# Patient Record
Sex: Female | Born: 1990 | Race: Black or African American | Hispanic: No | Marital: Single | Smoking: Never smoker
Health system: Southern US, Community
[De-identification: ages and names within clinical notes are randomized; demographics above are authoritative.]

## PROBLEM LIST (undated history)

## (undated) DIAGNOSIS — D649 Anemia, unspecified: Secondary | ICD-10-CM

---

## 2015-04-17 ENCOUNTER — Encounter (HOSPITAL_COMMUNITY): Payer: Self-pay | Admitting: Oncology

## 2015-04-17 ENCOUNTER — Emergency Department (HOSPITAL_COMMUNITY)
Admission: EM | Admit: 2015-04-17 | Discharge: 2015-04-17 | Discharge status: Against Medical Advice | Payer: Self-pay | Attending: Emergency Medicine | Admitting: Emergency Medicine

## 2015-04-17 DIAGNOSIS — Y9389 Activity, other specified: Secondary | ICD-10-CM | POA: Insufficient documentation

## 2015-04-17 DIAGNOSIS — Y998 Other external cause status: Secondary | ICD-10-CM | POA: Insufficient documentation

## 2015-04-17 DIAGNOSIS — S01311A Laceration without foreign body of right ear, initial encounter: Secondary | ICD-10-CM | POA: Insufficient documentation

## 2015-04-17 DIAGNOSIS — Y9289 Other specified places as the place of occurrence of the external cause: Secondary | ICD-10-CM | POA: Insufficient documentation

## 2015-04-17 DIAGNOSIS — Z720 Tobacco use: Secondary | ICD-10-CM | POA: Insufficient documentation
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
## 2016-08-08 ENCOUNTER — Encounter (HOSPITAL_COMMUNITY): Payer: Self-pay | Admitting: Family Medicine

## 2016-08-08 ENCOUNTER — Ambulatory Visit (HOSPITAL_COMMUNITY)
Admission: EM | Admit: 2016-08-08 | Discharge: 2016-08-08 | Disposition: A | Discharge status: Home / Self Care | Payer: Self-pay | Attending: Emergency Medicine | Admitting: Emergency Medicine

## 2016-08-08 DIAGNOSIS — K0889 Other specified disorders of teeth and supporting structures: Secondary | ICD-10-CM

## 2016-08-08 DIAGNOSIS — K029 Dental caries, unspecified: Secondary | ICD-10-CM

## 2016-08-08 MED ORDER — HYDROCODONE-ACETAMINOPHEN 5-325 MG PO TABS
1.0000 | ORAL_TABLET | Freq: Once | ORAL | Status: AC
  Administered 2016-08-08: 1 via ORAL

## 2016-08-08 MED ORDER — HYDROCODONE-ACETAMINOPHEN 5-325 MG PO TABS: ORAL_TABLET | ORAL | 0 refills | Status: DC

## 2016-08-08 MED ORDER — PENICILLIN V POTASSIUM 500 MG PO TABS: 500.0000 mg | ORAL_TABLET | Freq: Four times a day (QID) | ORAL | 0 refills | Status: DC

## 2016-08-08 MED ORDER — HYDROCODONE-ACETAMINOPHEN 5-325 MG PO TABS
ORAL_TABLET | ORAL | Status: AC
  Filled 2016-08-08: qty 1

## 2016-08-08 MED ORDER — IBUPROFEN 600 MG PO TABS: 600.0000 mg | ORAL_TABLET | Freq: Four times a day (QID) | ORAL | 0 refills | Status: DC | PRN

## 2016-08-08 MED ORDER — BUPIVACAINE-EPINEPHRINE (PF) 0.5% -1:200000 IJ SOLN
INTRAMUSCULAR | Status: AC
  Filled 2016-08-08: qty 1.8

## 2016-08-08 MED ORDER — CHLORHEXIDINE GLUCONATE 0.12 % MT SOLN: OROMUCOSAL | 0 refills | Status: DC

## 2016-08-08 NOTE — Discharge Instructions (Signed)
600 mg of ibuprofen with 1 g of Tylenol 4 times a day. Or you may take the ibuprofen with the Norco. Do not take the Norco and the Tylenol as they both have Tylenol in them and too much can hurt your liver. Do not exceed 4 g of Tylenol from all sources in one day. Each pill of Norco has 325 mg of Tylenol in it.   Look up the [facility]'s Missions of [facility] for free dental clinics. https://[email or web address]-garcia.[email or web address]://www.ncdental.org/ncds/Schedule.asp  Get there early and be prepared to wait. [employer] and [employer] have Armed forces operational officerdental hygienist schools that provide low cost routine dental care.   Other resources: [facility] [address] [place], [place] [phone]  Patients with Medicaid: [facility]                     [facility] [id number]                                [phone] Phone:  [phone]                                                  Phone:  [phone]  Dr. Renee RivalJanice Clark [address] [phone]  If unable to pay or uninsured, contact:  [facility] or [facility] to become qualified for the adult dental clinic.  No matter what dental problem you have, it will not get better unless you get good dental care.  If the tooth is not taken care of, your symptoms will come back in time and you will be visiting us again in the Urgent Care Center with a bad toothache.  So, see your dentist as soon as possible.  If you don't have a dentist, we can give you a list of dentists.  Sometimes the most cost effective treatment is removal of the tooth.  This can be done very inexpensively through one of the low cost [facility] Centers such as the facility on [address] in [place] [phone]).  The downside to this is that you will have one less tooth and this can effect your ability to chew.  Some other things that can be done for a dental infection include the following:  Rinse your mouth out with hot salt  water (1/2 tsp of table salt and a pinch of baking soda in 8 oz of hot water).  You can do this every 2 or 3 hours. Avoid cold foods, beverages, and cold air.  This will make your symptoms worse. Sleep with your head elevated.  Sleeping flat will cause your gums and oral tissues to swell and make them hurt more.  You can sleep on several pillows.  Even better is to sleep in a recliner with your head higher than your heart. External application of heat by a heating pad, hot water bottle, or hot wet towel can help with pain and speed healing.  You can do this every 2 to 3 hours. Do not fall asleep on a heating pad since this can cause a burn.

## 2016-08-08 NOTE — ED Triage Notes (Signed)
Pt here for dental pain x 2 days. sts right wisdom tooth.

## 2016-08-08 NOTE — ED Provider Notes (Signed)
HPI  SUBJECTIVE:  Cynthia Clark is a 26 y.o. female who presents with throbbing, stabbing, swelling right upper and lower dental pain for the past 2 days. States it is intermittent, lasting hours to days. She has tried ibuprofen 600 -800 mg, Tylenol, BC powders with no improvement in her symptoms. Symptoms are worse with exposure to air. She reports right-sided facial swelling and  pain radiating to her ear. She denies fevers, trismus, trauma to her tooth. No sore throat, difficulty breathing, neck swelling, nasal congestion. Past medical history negative for diabetes, hypertension. LMP: 2 weeks ago. Denies possibility of being pregnant. PMD: None. Dentist: States that she has one but cannot remember his name.    History reviewed. No pertinent past medical history.  History reviewed. No pertinent surgical history.  History reviewed. No pertinent family history.  Social History  Substance Use Topics  . Smoking status: Current Every Day Smoker  . Smokeless tobacco: Never Used  . Alcohol use Yes    No current facility-administered medications for this encounter.   Current Outpatient Prescriptions:  .  chlorhexidine (PERIDEX) 0.12 % solution, 15 mL swish and spit bid, Disp: 480 mL, Rfl: 0 .  HYDROcodone-acetaminophen (NORCO/VICODIN) 5-325 MG tablet, 1-2 tabs q 6hr prn pain, Disp: 20 tablet, Rfl: 0 .  ibuprofen (ADVIL,MOTRIN) 600 MG tablet, Take 1 tablet (600 mg total) by mouth every 6 (six) hours as needed., Disp: 30 tablet, Rfl: 0 .  penicillin v potassium (VEETID) 500 MG tablet, Take 1 tablet (500 mg total) by mouth 4 (four) times daily. X 10 days, Disp: 40 tablet, Rfl: 0  No Known Allergies   ROS  As noted in HPI.   Physical Exam  BP 129/83   Pulse 80   Temp 98.5 F (36.9 C)   Resp 18   SpO2 99%   Constitutional: Well developed, well nourished, no acute distress Eyes:  EOMI, conjunctiva normal bilaterally HENT: Normocephalic, atraumatic,mucus membranes moist. No  appreciable facial swelling, but positive tenderness along the upper and lower face. extensive dental decay. Right upper molars #3, 2, 1, with caries, tenderness, positive gingival swelling and no gingival expressible drainage. Tooth #30 the right lower first molar with caries, tender to palpation. No gingival swelling or Expressible purulent drainage. no trismus. airway patent. Neck: No cervical lymphadenopathy Respiratory: Normal inspiratory effort Cardiovascular: Normal rate GI: nondistended skin: No rash, skin intact Musculoskeletal: no deformities Neurologic: Alert & oriented x 3, no focal neuro deficits Psychiatric: Speech and behavior appropriate   ED Course   Medications  HYDROcodone-acetaminophen (NORCO/VICODIN) 5-325 MG per tablet 1 tablet (1 tablet Oral Given 08/08/16 1857)    No orders of the defined types were placed in this encounter.   No results found for this or any previous visit (from the past 24 hour(s)). No results found.  ED Clinical Impression  Dental caries  Toothache   ED Assessment/Plan  [facility] narcotic database reviewed. No opiate prescriptions in the past 6 months.  Performed dental block on upper or lower teeth with improvement in symptoms. Used approximately 0.5 cc of bupivacaine with epinephrine. Patient Tolerated procedure well.  Home with Peridex or Listerine, penicillin, Norco or Tylenol combined with ibuprofen 600 mg 4 times a day. She states that she has a Education officer, community and advised her that she will need to follow-up with him within the next several days.  Discussed , MDM, plan and followup with patient. Discussed sn/sx that should prompt return to the ED. Patient  agrees with plan.  Meds ordered this encounter  Medications  . HYDROcodone-acetaminophen (NORCO/VICODIN) 5-325 MG per tablet 1 tablet  . HYDROcodone-acetaminophen (NORCO/VICODIN) 5-325 MG tablet    Sig: 1-2 tabs q 6hr prn pain    Dispense:  20 tablet    Refill:  0  .  chlorhexidine (PERIDEX) 0.12 % solution    Sig: 15 mL swish and spit bid    Dispense:  480 mL    Refill:  0  . penicillin v potassium (VEETID) 500 MG tablet    Sig: Take 1 tablet (500 mg total) by mouth 4 (four) times daily. X 10 days    Dispense:  40 tablet    Refill:  0  . ibuprofen (ADVIL,MOTRIN) 600 MG tablet    Sig: Take 1 tablet (600 mg total) by mouth every 6 (six) hours as needed.    Dispense:  30 tablet    Refill:  0    *This clinic note was created using Scientist, clinical (histocompatibility and immunogenetics)Dragon dictation software. Therefore, there may be occasional mistakes despite careful proofreading.  ?   Domenick GongAshley Jamarr Treinen, MD 08/09/16 1714

## 2017-01-09 ENCOUNTER — Encounter (HOSPITAL_COMMUNITY): Payer: Self-pay | Admitting: Emergency Medicine

## 2017-01-09 ENCOUNTER — Emergency Department (HOSPITAL_COMMUNITY): Payer: Self-pay

## 2017-01-09 ENCOUNTER — Emergency Department (HOSPITAL_COMMUNITY)
Admission: EM | Admit: 2017-01-09 | Discharge: 2017-01-09 | Disposition: A | Discharge status: Home / Self Care | Payer: Self-pay | Attending: Emergency Medicine | Admitting: Emergency Medicine

## 2017-01-09 DIAGNOSIS — W228XXA Striking against or struck by other objects, initial encounter: Secondary | ICD-10-CM | POA: Insufficient documentation

## 2017-01-09 DIAGNOSIS — Y929 Unspecified place or not applicable: Secondary | ICD-10-CM | POA: Insufficient documentation

## 2017-01-09 DIAGNOSIS — F17200 Nicotine dependence, unspecified, uncomplicated: Secondary | ICD-10-CM | POA: Insufficient documentation

## 2017-01-09 DIAGNOSIS — S62339A Displaced fracture of neck of unspecified metacarpal bone, initial encounter for closed fracture: Secondary | ICD-10-CM

## 2017-01-09 DIAGNOSIS — S62306A Unspecified fracture of fifth metacarpal bone, right hand, initial encounter for closed fracture: Secondary | ICD-10-CM | POA: Insufficient documentation

## 2017-01-09 DIAGNOSIS — Y939 Activity, unspecified: Secondary | ICD-10-CM | POA: Insufficient documentation

## 2017-01-09 DIAGNOSIS — Y999 Unspecified external cause status: Secondary | ICD-10-CM | POA: Insufficient documentation
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
## 2017-01-09 NOTE — ED Notes (Signed)
Ortho at bedside.

## 2017-01-09 NOTE — Progress Notes (Signed)
Orthopedic Tech Progress Note Patient Details:  Wonda AmisKatrina Rayfield 1990-12-06 [id number]  Ortho Devices Type of Ortho Device: Ace wrap, Ulna gutter splint Ortho Device/Splint Location: rue Ortho Device/Splint Interventions: Application   Anthoni Geerts 01/09/2017, 10:34 AM

## 2017-01-09 NOTE — Discharge Instructions (Signed)
Read the information below.  You may return to the Emergency Department at any time for worsening condition or any new symptoms that concern you.   If you develop uncontrolled pain, weakness or numbness of the extremity, severe discoloration of the skin, or you are unable to move your fingers, return to the ER for a recheck.
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
## 2017-01-09 NOTE — ED Notes (Signed)
Spoke with Ortho who will apply splint.  

## 2017-04-29 ENCOUNTER — Emergency Department (HOSPITAL_COMMUNITY)
Admission: EM | Admit: 2017-04-29 | Discharge: 2017-04-30 | Disposition: A | Discharge status: Home / Self Care | Payer: Self-pay | Attending: Emergency Medicine | Admitting: Emergency Medicine

## 2017-04-29 DIAGNOSIS — F17210 Nicotine dependence, cigarettes, uncomplicated: Secondary | ICD-10-CM | POA: Insufficient documentation

## 2017-04-29 DIAGNOSIS — D509 Iron deficiency anemia, unspecified: Secondary | ICD-10-CM | POA: Insufficient documentation

## 2017-04-29 DIAGNOSIS — F10920 Alcohol use, unspecified with intoxication, uncomplicated: Secondary | ICD-10-CM

## 2017-04-29 DIAGNOSIS — F10929 Alcohol use, unspecified with intoxication, unspecified: Secondary | ICD-10-CM | POA: Insufficient documentation

## 2017-04-29 DIAGNOSIS — Z79899 Other long term (current) drug therapy: Secondary | ICD-10-CM | POA: Insufficient documentation

## 2017-04-29 MED ORDER — AMMONIA AROMATIC IN INHA
RESPIRATORY_TRACT | Status: AC
  Administered 2017-04-30: 01:00:00
  Filled 2017-04-29: qty 10

## 2017-04-29 NOTE — ED Triage Notes (Signed)
Pt brought to ED via [facility]. Pt admitted to ETOH but no drug use, alert and oriented to person and place.

## 2017-04-29 NOTE — ED Provider Notes (Signed)
[facility]-EMERGENCY DEPT Provider Note   CSN: [id number] Arrival date & time: 04/29/17  2328     History   Chief Complaint Chief Complaint  Patient presents with  . Alcohol Intoxication    LEVEL 5 CAVEAT 2/2 INTOXICATION  HPI Cynthia Clark is a 26 y.o. female.  26 year old female presents to the emergency department by EMS.  She was [place] from [place], intoxicated.  Patient allegedly admitted to alcohol use, but denied drug use.  She was alert and oriented to person and place with a EMS.  She is acutely intoxicated and will not wake to sternal rubbing, but will wince with ammonia capsule. Patient unable to contribute to hx due to acute intoxication.      No past medical history on file.  There are no active problems to display for this patient.   No past surgical history on file.  OB History    No data available       Home Medications    Prior to Admission medications   Medication Sig Start Date End Date Taking? Authorizing Provider  chlorhexidine (PERIDEX) 0.12 % solution 15 mL swish and spit bid 08/08/16   Domenick GongMortenson, Ashley, MD  HYDROcodone-acetaminophen (NORCO/VICODIN) 5-325 MG tablet 1-2 tabs q 6hr prn pain 08/08/16   Domenick GongMortenson, Ashley, MD  ibuprofen (ADVIL,MOTRIN) 600 MG tablet Take 1 tablet (600 mg total) by mouth every 6 (six) hours as needed. 08/08/16   Domenick GongMortenson, Ashley, MD  penicillin v potassium (VEETID) 500 MG tablet Take 1 tablet (500 mg total) by mouth 4 (four) times daily. X 10 days 08/08/16   Domenick GongMortenson, Ashley, MD    Family History No family history on file.  Social History Social History  Substance Use Topics  . Smoking status: Current Every Day Smoker  . Smokeless tobacco: Never Used  . Alcohol use Yes     Allergies   Patient has no known allergies.   Review of Systems Review of Systems  Unable to perform ROS: Mental status change  INTOXICATED   Physical Exam Updated Vital Signs BP (!) 88/48 (BP Location:  Left Arm)   Pulse 74   Temp 98.3 F (36.8 C) (Oral)   Resp 11   SpO2 100%   Physical Exam  Constitutional: She appears well-developed and well-nourished. No distress.  HENT:  Head: Normocephalic and atraumatic.  Eyes: Conjunctivae and EOM are normal. No scleral icterus.  Neck: Normal range of motion.  Pulmonary/Chest: Effort normal. No respiratory distress. She has no wheezes.  Respirations even and unlabored  Musculoskeletal: Normal range of motion.  Neurological: She is alert.  Spontaneously moving extremities. Will flinch with ammonia capsule. No verbal response. Will not open eyes.  Skin: Skin is warm and dry. No rash noted. She is not diaphoretic. No erythema. No pallor.  Psychiatric: She has a normal mood and affect. Her behavior is normal.  Nursing note and vitals reviewed.    ED Treatments / Results  Labs (all labs ordered are listed, but only abnormal results are displayed) Labs Reviewed  CBC WITH DIFFERENTIAL/PLATELET - Abnormal; Notable for the following:       Result Value   RBC 3.86 (*)    Hemoglobin 6.1 (*)    HCT 23.8 (*)    MCV 61.7 (*)    MCH 15.8 (*)    MCHC 25.6 (*)    RDW 19.7 (*)    Neutro Abs 1.4 (*)    All other components within normal limits  COMPREHENSIVE METABOLIC PANEL -  Abnormal; Notable for the following:    Potassium 3.3 (*)    CO2 21 (*)    ALT 11 (*)    All other components within normal limits  ETHANOL - Abnormal; Notable for the following:    Alcohol, Ethyl (B) 298 (*)    All other components within normal limits  RETICULOCYTES - Abnormal; Notable for the following:    RBC. 3.81 (*)    All other components within normal limits  RAPID URINE DRUG SCREEN, HOSP PERFORMED  I-STAT BETA HCG BLOOD, ED ([facility], [facility], [facility] ONLY)  TYPE AND SCREEN  ABO/RH    EKG  EKG Interpretation None       Radiology No results found.  Procedures Procedures (including critical care time)  Medications Ordered in ED Medications  ammonia inhalant (   Given 04/30/17 0109)  pantoprazole (PROTONIX) injection 40 mg (40 mg Intravenous Given 04/30/17 0108)  sodium chloride 0.9 % bolus 1,000 mL (0 mLs Intravenous Stopped 04/30/17 0345)     Initial Impression / Assessment and Plan / ED Course  I have reviewed the triage vital signs and the nursing notes.  Pertinent labs & imaging results that were available during my care of the patient were reviewed by me and considered in my medical decision making (see chart for details).    12:00 AM Patient presents by EMS from [place] where she was found to be intoxicated.  Patient ordered to receive IV fluids as well as Protonix.  Basic labs ordered in the setting of altered mental status, though this is suspected secondary to intoxication at this time.  12:40 AM Notified by RN that patient with hemoglobin of 6.1.  Additional laboratory workup ordered. Will discuss results with patient upon further sobering.  4:05 AM Patient up and ambulatory. Gait steady. Speech is clear. Does report hx of heavy drinking. Also states that she has a hx of anemia. MCV lends itself to chronic iron deficiency anemia. Vital signs do not suggest acute blood loss. No tachycardia, hypoxia, c/o SOB. Patient states that she is supposed to be taking iron tablets for her known anemia, but she has decided that she no longer wants to take this supplement. Denies hx of sickle cell.  4:35 AM Per RN tech, patient came and retrieved belongings and subsequently left the department. Patient "wanted to get a shower at home". She was seen ambulating out of the department in satisfactory condition, stating she will "be back tomorrow". Disposition set to eloped.   Final Clinical Impressions(s) / ED Diagnoses   Final diagnoses:  Alcoholic intoxication without complication (HCC)  Iron deficiency anemia, unspecified iron deficiency anemia type    New Prescriptions New Prescriptions   No medications on file     Antony Madura,  PA-C 04/30/17 8119    Shon Baton, MD 04/30/17 [id number]

## 2017-04-29 NOTE — ED Notes (Signed)
[facility] Expected date:  Expected time:  Means of arrival:  Comments: 26 yo F  ETOH, responsive to painful stimuli

## 2017-04-29 NOTE — ED Notes (Signed)
Pt able to be aroused with an ammonia capsule, but immediately laid back down and went to sleep. She refuses to answer questions.

## 2017-04-30 LAB — COMPREHENSIVE METABOLIC PANEL
ALBUMIN: 4.4 g/dL (ref 3.5–5.0)
ALK PHOS: 42 U/L (ref 38–126)
ALT: 11 U/L — AB (ref 14–54)
AST: 18 U/L (ref 15–41)
Anion gap: 11 (ref 5–15)
BILIRUBIN TOTAL: 0.4 mg/dL (ref 0.3–1.2)
BUN: 7 mg/dL (ref 6–20)
CALCIUM: 8.9 mg/dL (ref 8.9–10.3)
CO2: 21 mmol/L — AB (ref 22–32)
CREATININE: 0.78 mg/dL (ref 0.44–1.00)
Chloride: 110 mmol/L (ref 101–111)
GFR calc Af Amer: >60 mL/min (ref >60)
GFR calc non Af Amer: >60 mL/min (ref >60)
GLUCOSE: 84 mg/dL (ref 65–99)
Potassium: 3.3 mmol/L — ABNORMAL LOW (ref 3.5–5.1)
Sodium: 142 mmol/L (ref 135–145)
TOTAL PROTEIN: 8 g/dL (ref 6.5–8.1)

## 2017-04-30 LAB — RETICULOCYTES
RBC.: 3.81 MIL/uL — AB (ref 3.87–5.11)
RETIC COUNT ABSOLUTE: 41.9 10*3/uL (ref 19.0–186.0)
Retic Ct Pct: 1.1 % (ref 0.4–3.1)

## 2017-04-30 LAB — ABO/RH: ABO/RH(D): A POS

## 2017-04-30 LAB — CBC WITH DIFFERENTIAL/PLATELET
Basophils Absolute: 0.1 10*3/uL (ref 0.0–0.1)
Basophils Relative: 1 %
Eosinophils Absolute: 0.1 10*3/uL (ref 0.0–0.7)
Eosinophils Relative: 2 %
HEMATOCRIT: 23.8 % — AB (ref 36.0–46.0)
HEMOGLOBIN: 6.1 g/dL — AB (ref 12.0–15.0)
LYMPHS ABS: 2.6 10*3/uL (ref 0.7–4.0)
LYMPHS PCT: 57 %
MCH: 15.8 pg — AB (ref 26.0–34.0)
MCHC: 25.6 g/dL — ABNORMAL LOW (ref 30.0–36.0)
MCV: 61.7 fL — AB (ref 78.0–100.0)
MONOS PCT: 10 %
Monocytes Absolute: 0.5 10*3/uL (ref 0.1–1.0)
NEUTROS PCT: 30 %
Neutro Abs: 1.4 10*3/uL — ABNORMAL LOW (ref 1.7–7.7)
Platelets: 192 10*3/uL (ref 150–400)
RBC: 3.86 MIL/uL — ABNORMAL LOW (ref 3.87–5.11)
RDW: 19.7 % — ABNORMAL HIGH (ref 11.5–15.5)
WBC: 4.5 10*3/uL (ref 4.0–10.5)

## 2017-04-30 LAB — I-STAT BETA HCG BLOOD, ED (MC, WL, AP ONLY): I-stat hCG, quantitative: <5 m[IU]/mL (ref <5)

## 2017-04-30 LAB — ETHANOL: Alcohol, Ethyl (B): 298 mg/dL — ABNORMAL HIGH (ref <10)

## 2017-04-30 MED ORDER — SODIUM CHLORIDE 0.9 % IV BOLUS (SEPSIS): 1000.0000 mL | Freq: Once | INTRAVENOUS | Status: DC

## 2017-04-30 MED ORDER — SODIUM CHLORIDE 0.9 % IV BOLUS (SEPSIS)
1000.0000 mL | Freq: Once | INTRAVENOUS | Status: AC
  Administered 2017-04-30: 1000 mL via INTRAVENOUS

## 2017-04-30 MED ORDER — PANTOPRAZOLE SODIUM 40 MG IV SOLR
40.0000 mg | Freq: Once | INTRAVENOUS | Status: AC
  Administered 2017-04-30: 40 mg via INTRAVENOUS
  Filled 2017-04-30: qty 40

## 2017-04-30 NOTE — ED Notes (Signed)
CRITICAL VALUE STICKER  CRITICAL VALUE: Hgb 6.1  RECEIVER (on-site recipient of call): Jake T RN  DATE & TIME NOTIFIED: 04/30/17, 0031  MESSENGER (representative from lab): Amy  MD NOTIFIED: Tresa EndoKelly PA  TIME OF NOTIFICATION: 0032  RESPONSE: awaiting orders

## 2017-05-01 ENCOUNTER — Observation Stay (HOSPITAL_COMMUNITY): Payer: Self-pay

## 2017-05-01 ENCOUNTER — Encounter (HOSPITAL_COMMUNITY): Payer: Self-pay

## 2017-05-01 ENCOUNTER — Emergency Department (HOSPITAL_COMMUNITY): Payer: Self-pay

## 2017-05-01 ENCOUNTER — Observation Stay (HOSPITAL_COMMUNITY)
Admission: EM | Admit: 2017-05-01 | Discharge: 2017-05-01 | Disposition: A | Discharge status: Home / Self Care | Payer: Self-pay | Attending: Internal Medicine | Admitting: Internal Medicine

## 2017-05-01 DIAGNOSIS — D500 Iron deficiency anemia secondary to blood loss (chronic): Principal | ICD-10-CM | POA: Insufficient documentation

## 2017-05-01 DIAGNOSIS — D649 Anemia, unspecified: Secondary | ICD-10-CM

## 2017-05-01 DIAGNOSIS — R0789 Other chest pain: Secondary | ICD-10-CM | POA: Insufficient documentation

## 2017-05-01 DIAGNOSIS — Z7982 Long term (current) use of aspirin: Secondary | ICD-10-CM | POA: Insufficient documentation

## 2017-05-01 DIAGNOSIS — N920 Excessive and frequent menstruation with regular cycle: Secondary | ICD-10-CM | POA: Insufficient documentation

## 2017-05-01 DIAGNOSIS — M940 Chondrocostal junction syndrome [Tietze]: Secondary | ICD-10-CM

## 2017-05-01 DIAGNOSIS — E876 Hypokalemia: Secondary | ICD-10-CM | POA: Insufficient documentation

## 2017-05-01 DIAGNOSIS — D509 Iron deficiency anemia, unspecified: Secondary | ICD-10-CM

## 2017-05-01 DIAGNOSIS — R079 Chest pain, unspecified: Secondary | ICD-10-CM

## 2017-05-01 DIAGNOSIS — F17200 Nicotine dependence, unspecified, uncomplicated: Secondary | ICD-10-CM | POA: Insufficient documentation

## 2017-05-01 HISTORY — DX: Anemia, unspecified: D64.9

## 2017-05-01 LAB — TYPE AND SCREEN
ABO/RH(D): A POS
Antibody Screen: NEGATIVE

## 2017-05-01 LAB — CBC
HEMATOCRIT: 26.1 % — AB (ref 36.0–46.0)
HEMOGLOBIN: 6.9 g/dL — AB (ref 12.0–15.0)
MCH: 16.4 pg — AB (ref 26.0–34.0)
MCHC: 26.4 g/dL — AB (ref 30.0–36.0)
MCV: 62 fL — AB (ref 78.0–100.0)
Platelets: 188 10*3/uL (ref 150–400)
RBC: 4.21 MIL/uL (ref 3.87–5.11)
RDW: 19.7 % — ABNORMAL HIGH (ref 11.5–15.5)
WBC: 5.5 10*3/uL (ref 4.0–10.5)

## 2017-05-01 LAB — BASIC METABOLIC PANEL
ANION GAP: 12 (ref 5–15)
BUN: 10 mg/dL (ref 6–20)
CHLORIDE: 105 mmol/L (ref 101–111)
CO2: 20 mmol/L — AB (ref 22–32)
Calcium: 8.7 mg/dL — ABNORMAL LOW (ref 8.9–10.3)
Creatinine, Ser: 0.77 mg/dL (ref 0.44–1.00)
GFR calc non Af Amer: >60 mL/min (ref >60)
GLUCOSE: 87 mg/dL (ref 65–99)
Potassium: 3.4 mmol/L — ABNORMAL LOW (ref 3.5–5.1)
Sodium: 137 mmol/L (ref 135–145)

## 2017-05-01 LAB — I-STAT TROPONIN, ED: Troponin i, poc: 0 ng/mL (ref 0.00–0.08)

## 2017-05-01 LAB — IRON AND TIBC
IRON: 112 ug/dL (ref 28–170)
Saturation Ratios: 22 % (ref 10.4–31.8)
TIBC: 501 ug/dL — ABNORMAL HIGH (ref 250–450)
UIBC: 389 ug/dL

## 2017-05-01 LAB — FERRITIN: FERRITIN: 2 ng/mL — AB (ref 11–307)

## 2017-05-01 LAB — HEMOGLOBIN AND HEMATOCRIT, BLOOD
HCT: 29.7 % — ABNORMAL LOW (ref 36.0–46.0)
Hemoglobin: 8.5 g/dL — ABNORMAL LOW (ref 12.0–15.0)

## 2017-05-01 LAB — PREPARE RBC (CROSSMATCH)

## 2017-05-01 LAB — ECHOCARDIOGRAM COMPLETE

## 2017-05-01 MED ORDER — SODIUM CHLORIDE 0.9% FLUSH: 3.0000 mL | Freq: Two times a day (BID) | INTRAVENOUS | Status: DC

## 2017-05-01 MED ORDER — ENOXAPARIN SODIUM 40 MG/0.4ML ~~LOC~~ SOLN: 40.0000 mg | SUBCUTANEOUS | Status: DC

## 2017-05-01 MED ORDER — SODIUM CHLORIDE 0.9 % IV SOLN
10.0000 mL/h | Freq: Once | INTRAVENOUS | Status: AC
  Administered 2017-05-01: 10 mL/h via INTRAVENOUS

## 2017-05-01 MED ORDER — POTASSIUM CHLORIDE CRYS ER 20 MEQ PO TBCR
40.0000 meq | EXTENDED_RELEASE_TABLET | Freq: Once | ORAL | Status: AC
  Administered 2017-05-01: 40 meq via ORAL
  Filled 2017-05-01: qty 2

## 2017-05-01 MED ORDER — ACETAMINOPHEN 325 MG PO TABS: 650.0000 mg | ORAL_TABLET | Freq: Four times a day (QID) | ORAL | Status: DC | PRN

## 2017-05-01 MED ORDER — SODIUM CHLORIDE 0.9% FLUSH: 3.0000 mL | INTRAVENOUS | Status: DC | PRN

## 2017-05-01 MED ORDER — POTASSIUM CHLORIDE CRYS ER 20 MEQ PO TBCR
20.0000 meq | EXTENDED_RELEASE_TABLET | Freq: Once | ORAL | Status: AC
  Administered 2017-05-01: 20 meq via ORAL
  Filled 2017-05-01: qty 1

## 2017-05-01 MED ORDER — SENNA 8.6 MG PO TABS: 1.0000 | ORAL_TABLET | Freq: Every day | ORAL | 0 refills | Status: AC

## 2017-05-01 MED ORDER — IBUPROFEN 400 MG PO TABS: 400.0000 mg | ORAL_TABLET | Freq: Three times a day (TID) | ORAL | 0 refills | Status: AC

## 2017-05-01 MED ORDER — IBUPROFEN 200 MG PO TABS
400.0000 mg | ORAL_TABLET | Freq: Three times a day (TID) | ORAL | Status: DC
  Administered 2017-05-01: 400 mg via ORAL
  Filled 2017-05-01: qty 2

## 2017-05-01 MED ORDER — SODIUM CHLORIDE 0.9 % IV SOLN: 250.0000 mL | INTRAVENOUS | Status: DC | PRN

## 2017-05-01 MED ORDER — ACETAMINOPHEN 650 MG RE SUPP
650.0000 mg | Freq: Four times a day (QID) | RECTAL | Status: DC | PRN
Start: 2017-05-01 — End: 2017-05-01

## 2017-05-01 MED ORDER — FERROUS SULFATE 325 (65 FE) MG PO TBEC: 325.0000 mg | DELAYED_RELEASE_TABLET | Freq: Three times a day (TID) | ORAL | 3 refills | Status: AC

## 2017-05-01 MED ORDER — SODIUM CHLORIDE 0.9 % IV SOLN: Freq: Once | INTRAVENOUS | Status: DC

## 2017-05-01 NOTE — Progress Notes (Signed)
  Echocardiogram 2D Echocardiogram has been performed.  Leta JunglingCooper, Shuree Brossart M 05/01/2017, 8:28 AM

## 2017-05-01 NOTE — H&P (Signed)
TRH H&P   Patient Demographics:    Cynthia Clark, is a 26 y.o. female  MRN: [id number]   DOB - 24-Oct-1990  Admit Date - 05/01/2017  Outpatient Primary MD for the patient is Patient, No Pcp Per NONE  Referring MD/NP/PA: Oliver Barre  Outpatient Specialists:   Patient coming from: home  Chief Complaint  Patient presents with  . low hemoglobin      HPI:    Cynthia Clark  is a 26 y.o. female, w hx of anemia (heavy periods), apparently presented to ED last nite and was noted to be anemic but left AMA due to ETOH intoxication.  Pt notes  Chest pain "sharp" starting in the afternoon on 04/30/2017.  Denies fever, chills, cough, palp, sob, n/v, diarrhea, brbpr, black stool.   Pt is not currently having her period. Pt does note that she has not been taking her iron  In Ed,  K 3.4 Bun 10, Creatinine 0.77  Wbc 5.5, Hgb 6.9 Plt 188  CXR IMPRESSION: No acute pulmonary process.  EKG  NSR at 85, nl axis, no st-t changes c/w ischemia  Pt will be admitted for chest pain likely costochondritis and anemia     Review of systems:    In addition to the HPI above,   No Fever-chills, No Headache, No changes with Vision or hearing, No problems swallowing food or Liquids, No Cough or Shortness of Breath, No Abdominal pain, No Nausea or Vommitting, Bowel movements are regular, No Blood in stool or Urine, No dysuria, No new skin rashes or bruises, No new joints pains-aches,  No new weakness, tingling, numbness in any extremity, No recent weight gain or loss, No polyuria, polydypsia or polyphagia, No significant Mental Stressors.  A full 10 point Review of Systems was done, except as stated above, all other Review of Systems were negative.   With Past History of the following :    Past Medical History:  Diagnosis Date  . Anemia       History reviewed. No pertinent  surgical history.    Social History:     Social History  Substance Use Topics  . Smoking status: Current Every Day Smoker  . Smokeless tobacco: Never Used  . Alcohol use Yes     Lives - at home  Mobility - walks by self   Family History :     Family History  Problem Relation Age of Onset  . Cirrhosis Father       Home Medications:   Prior to Admission medications   Medication Sig Start Date End Date Taking? Authorizing Provider  acetaminophen (TYLENOL) 500 MG tablet Take 1,000 mg by mouth every 6 (six) hours as needed for mild pain or headache.   Yes [provider]  Aspirin-Salicylamide-Caffeine (ARTHRITIS STRENGTH BC POWDER PO) Take 1 packet by mouth daily as needed (HA).   Yes [provider]  Allergies:    No Known Allergies   Physical Exam:   Vitals  Blood pressure 108/60, pulse 72, temperature 99.3 F (37.4 C), temperature source Oral, resp. rate 18, last menstrual period 04/15/2017, SpO2 100 %.   1. General  lying in bed in NAD,    2. Normal affect and insight, Not Suicidal or Homicidal, Awake Alert, Oriented X 3.  3. No F.N deficits, ALL C.Nerves Intact, Strength 5/5 all 4 extremities, Sensation intact all 4 extremities, Plantars down going.  4. Ears and Eyes appear Normal, Conjunctivae clear, PERRLA. Moist Oral Mucosa.  5. Supple Neck, No JVD, No cervical lymphadenopathy appriciated, No Carotid Bruits.  6. Symmetrical Chest wall movement, Good air movement bilaterally, CTAB.  7. RRR, No Gallops, Rubs or Murmurs, No Parasternal Heave.  8. Positive Bowel Sounds, Abdomen Soft, No tenderness, No organomegaly appriciated,No rebound -guarding or rigidity.  9.  No Cyanosis, Normal Skin Turgor, No Skin Rash or Bruise.  10. Good muscle tone,  joints appear normal , no effusions, Normal ROM.  11. No Palpable Lymph Nodes in Neck or Axillae   Chest tenderness , reproducible with palpation   Data Review:    CBC  Recent  Labs Lab 04/29/17 2359 05/01/17 0036  WBC 4.5 5.5  HGB 6.1* 6.9*  HCT 23.8* 26.1*  PLT 192 188  MCV 61.7* 62.0*  MCH 15.8* 16.4*  MCHC 25.6* 26.4*  RDW 19.7* 19.7*  LYMPHSABS 2.6  --   MONOABS 0.5  --   EOSABS 0.1  --   BASOSABS 0.1  --    ------------------------------------------------------------------------------------------------------------------  Chemistries   Recent Labs Lab 04/29/17 2359 05/01/17 0036  NA 142 137  K 3.3* 3.4*  CL 110 105  CO2 21* 20*  GLUCOSE 84 87  BUN 7 10  CREATININE 0.78 0.77  CALCIUM 8.9 8.7*  AST 18  --   ALT 11*  --   ALKPHOS 42  --   BILITOT 0.4  --    ------------------------------------------------------------------------------------------------------------------ CrCl cannot be calculated (Unknown ideal weight.). ------------------------------------------------------------------------------------------------------------------ No results for input(s): TSH, T4TOTAL, T3FREE, THYROIDAB in the last 72 hours.  Invalid input(s): FREET3  Coagulation profile No results for input(s): INR, PROTIME in the last 168 hours. ------------------------------------------------------------------------------------------------------------------- No results for input(s): DDIMER in the last 72 hours. -------------------------------------------------------------------------------------------------------------------  Cardiac Enzymes No results for input(s): CKMB, TROPONINI, MYOGLOBIN in the last 168 hours.  Invalid input(s): CK ------------------------------------------------------------------------------------------------------------------ No results found for: BNP   ---------------------------------------------------------------------------------------------------------------  Urinalysis No results found for: COLORURINE, APPEARANCEUR, LABSPEC, PHURINE, GLUCOSEU, HGBUR, BILIRUBINUR, KETONESUR, PROTEINUR, UROBILINOGEN, NITRITE,  LEUKOCYTESUR  ----------------------------------------------------------------------------------------------------------------   Imaging Results:    Dg Chest 2 View  Result Date: 05/01/2017 CLINICAL DATA:  Chest pain. EXAM: CHEST  2 VIEW COMPARISON:  None. FINDINGS: The cardiomediastinal contours are normal. The lungs are clear. Pulmonary vasculature is normal. No consolidation, pleural effusion, or pneumothorax. No acute osseous abnormalities are seen. Incidental cervical ribs, left greater than right. IMPRESSION: No acute pulmonary process. Electronically Signed   By: Rubye OaksMelanie  Ehinger M.D.   On: 05/01/2017 01:09      Assessment & Plan:    Active Problems:   Anemia    Anemia Check ferritin, iron, tibc, b12, folate Transfuse 2 units prbc Check h/h 1 hour after transfusion Can be discharge after transfusion  Chest pain, atypical, likely costochondritis  Tele Trop I q6hx3 Check cardiac echo  Hypokalemia Replete Check bmp in am  DVT Prophylaxis  Lovenox - SCDs  AM Labs Ordered, also please review Full Orders  Family Communication: Admission, patients condition and plan of care including tests being ordered have been discussed with the patient  who indicate understanding and agree with the plan and Code Status.  Code Status FULL CODE  Likely DC to  home  Condition GUARDED   Consults called: none  Admission status: observation   Time spent in minutes : 45   Pearson Grippe M.D on 05/01/2017 at 3:37 AM  Between 7am to 7pm - Pager - [phone]. After 7pm go to [email or web address] - password [facility]  [facility] - Office  [phone]

## 2017-05-01 NOTE — Care Management Note (Signed)
Case Management Note  Patient Details  Name: Cynthia Clark MRN: [id number] Date of Birth: 1991/05/14  Subjective/Objective:                   anemia  Action/Plan: Date:  May 01, 2017 Chart reviewed for concurrent status and case management needs.  Will continue to follow patient progress.  Discharge Planning: following for needs  Expected discharge date: [id number]  Cynthia Clark, BSN, WarnerRN3, [place]CCM   [phone]   Expected Discharge Date:   (unknown)               Expected Discharge Plan:  Home/Self Care  In-House Referral:     Discharge planning Services  CM Consult, [facility]  Post Acute Care Choice:    Choice offered to:     DME Arranged:    DME Agency:     HH Arranged:    HH Agency:     Status of Service:  In process, will continue to follow  If discussed at Long Length of Stay Meetings, dates discussed:    Additional Comments:  Cynthia Clark, Cynthia Methvin Lynn, RN 05/01/2017, 11:25 AM

## 2017-05-01 NOTE — ED Provider Notes (Signed)
1:50 AM patient states she was seen in the ED last night but she was intoxicated and left.  She had a hemoglobin of 6.  She states she does have heavy periods for the first 3-day of her period and they normally last about 7 days.  She denies having shortness of breath or dyspnea on exertion or weakness however she states tonight about midnight she was awakened with central chest pressure.  She states she is never had that before.  Medical screening examination/treatment/procedure(s) were conducted as a shared visit with non-physician practitioner(s) and myself.  I personally evaluated the patient during the encounter.   EKG Interpretation  Date/Time:  Thursday May 01 2017 00:34:44 EDT Ventricular Rate:  75 PR Interval:    QRS Duration: 75 QT Interval:  375 QTC Calculation: 419 R Axis:   68 Text Interpretation:  Sinus rhythm Baseline wander in lead(s) V5 Otherwise within normal limits No old tracing to compare Confirmed by Devoria AlbeKnapp, Keyante Durio ([id number]) on 05/01/2017 12:52:14 AM       Devoria AlbeIva Kewana Sanon, MD, Concha PyoFACEP    Kani Chauvin, MD 05/01/17 [phone]

## 2017-05-01 NOTE — Discharge Summary (Signed)
Physician Discharge Summary  Cynthia Clark ZOX:[id number] DOB: [birth year] DOA: 05/01/2017  PCP: Patient, No Pcp Per  Admit date: 05/01/2017 Discharge date: 05/01/2017  Time spent: 60 minutes  Recommendations for Outpatient Follow-up:  1. Follow-up with PCP in 1-2 weeks. Patient was given information by case manager to help find a PCP. On follow-up patient will need a CBC done to follow-up on H&H. Patient also need a basic metabolic profile done to follow-up on electrolytes and renal function. Patient will need further management of her menorrhagia.   Discharge Diagnoses:  Principal Problem:   Symptomatic anemia Active Problems:   Chest pain   Anemia   Hypokalemia   Menorrhagia   Costochondritis   Discharge Condition: Stable and improved  Diet recommendation: Regular  Filed Weights   05/01/17 0838  Weight: 54.4 kg (120 lb)    History of present illness:  Per Dr. Earleen ReaperKim  Cynthia Clark  is a 26 y.o. female, w hx of anemia (heavy periods), apparently presented to ED the night prior to admission, and was noted to be anemic but left AMA due to ETOH intoxication.  Pt noted  Chest pain "sharp" starting in the afternoon on 04/30/2017.  Denied fever, chills, cough, palp, sob, n/v, diarrhea, brbpr, black stool.   Pt is not currently having her period. Pt did note that she had not been taking her iron.  Hospital Course:  #1 symptomatic microcytic iron deficiency anemia Patient had presented with some substernal chest pain and was noted to be significantly anemic on admission. Patient denied any shortness of breath. Patient did note menorrhagia. Patient also noted not be taking the oral iron pills. Anemia panel was done and patient was noted to have a iron of 112 and a ferritin of 2. Patient was transfused 2 units packed red blood cells with clinical improvement. Patient's hemoglobin came up to 8.5 from 6.9. Patient remained asymptomatic. Due to patient's heavy menses patient was asking for oral  contraceptive pills however this was deferred for outpatient follow-up with her PCP. Patient be discharged on oral iron 325 mg 3 times a day and is to pick a PCP to follow-up closely with.  #2 chest pain atypical/probable costochondritis Patient had presented with midsternal chest pain with atypical features that she described it as a sharp pain. Chest pain was reproducible. Point-of-care troponin was negative. EKG done showed a normal sinus rhythm with no ischemic changes noted. 2-D echo was obtained with a EF of 55-60% with no wall motion abnormalities. Patient will be placed on ibuprofen 400 mg 3 times daily for 4 days and then every 6 hours as needed. Outpatient follow-up.  #3 hypokalemia Repleted.  Procedures:  2 units PRBC 05/01/2017  2-D echo 05/01/2017  Chest x-ray 05/01/2017  Consultations:  none  Discharge Exam: Vitals:   05/01/17 1404 05/01/17 1434  BP: (!) 99/51 108/66  Pulse: 78   Resp: 12   Temp: 98.2 F (36.8 C)   SpO2: 100%     General: NAD Cardiovascular: RRR. Chest wall with some TTP. Respiratory: CTAB  Discharge Instructions   Discharge Instructions    Diet general    Complete by:  As directed    Increase activity slowly    Complete by:  As directed      Current Discharge Medication List    START taking these medications   Details  ferrous sulfate 325 (65 FE) MG EC tablet Take 1 tablet (325 mg total) by mouth 3 (three) times daily with meals. Refills: 3  ibuprofen (ADVIL,MOTRIN) 400 MG tablet Take 1 tablet (400 mg total) by mouth 3 (three) times daily. Take 3 times daily x 4 days then every 6 hours as needed. Qty: 30 tablet, Refills: 0    senna (SENOKOT) 8.6 MG TABS tablet Take 1 tablet (8.6 mg total) by mouth at bedtime. Qty: 120 each, Refills: 0      CONTINUE these medications which have NOT CHANGED   Details  acetaminophen (TYLENOL) 500 MG tablet Take 1,000 mg by mouth every 6 (six) hours as needed for mild pain or headache.     Aspirin-Salicylamide-Caffeine (ARTHRITIS STRENGTH BC POWDER PO) Take 1 packet by mouth daily as needed (HA).       No Known Allergies Follow-up Information    [facility]. Schedule an appointment as soon as possible for a visit.   Contact information: [address] [place] [address] [phone]       [facility]. Schedule an appointment as soon as possible for a visit.   Specialty:  Internal Medicine Why:  if no appointment at [facility] call here  Contact information: [address] [place] [address] [phone]       [facility] Follow up.   Specialty:  Obstetrics and Gynecology Why:  3rd resource for appointment to get a pcp Contact information: [address] [place] [address]           The results of significant diagnostics from this hospitalization (including imaging, microbiology, ancillary and laboratory) are listed below for reference.    Significant Diagnostic Studies: Dg Chest 2 View  Result Date: 05/01/2017 CLINICAL DATA:  Chest pain. EXAM: CHEST  2 VIEW COMPARISON:  None. FINDINGS: The cardiomediastinal contours are normal. The lungs are clear. Pulmonary vasculature is normal. No consolidation, pleural effusion, or pneumothorax. No acute osseous abnormalities are seen. Incidental cervical ribs, left greater than right. IMPRESSION: No acute pulmonary process. Electronically Signed   By: Rubye Oaks M.D.   On: 05/01/2017 01:09    Microbiology: No results found for this or any previous visit (from the past 240 hour(s)).   Labs: Basic Metabolic Panel:  Recent Labs Lab 04/29/17 2359 05/01/17 0036  NA 142 137  K 3.3* 3.4*  CL 110 105  CO2 21* 20*  GLUCOSE 84 87  BUN 7 10  CREATININE 0.78 0.77  CALCIUM 8.9 8.7*   Liver Function Tests:  Recent Labs Lab 04/29/17 2359  AST 18  ALT 11*  ALKPHOS 42  BILITOT  0.4  PROT 8.0  ALBUMIN 4.4   No results for input(s): LIPASE, AMYLASE in the last 168 hours. No results for input(s): AMMONIA in the last 168 hours. CBC:  Recent Labs Lab 04/29/17 2359 05/01/17 0036 05/01/17 1136  WBC 4.5 5.5  --   NEUTROABS 1.4*  --   --   HGB 6.1* 6.9* 8.5*  HCT 23.8* 26.1* 29.7*  MCV 61.7* 62.0*  --   PLT 192 188  --    Cardiac Enzymes: No results for input(s): CKTOTAL, CKMB, CKMBINDEX, TROPONINI in the last 168 hours. BNP: BNP (last 3 results) No results for input(s): BNP in the last 8760 hours.  ProBNP (last 3 results) No results for input(s): PROBNP in the last 8760 hours.  CBG: No results for input(s): GLUCAP in the last 168 hours.     SignedRamiro Harvest MD.  [facility] 05/01/2017, 3:51 PM

## 2017-05-01 NOTE — ED Notes (Signed)
Bed: WTR6 Expected date:  Expected time:  Means of arrival:  Comments: 

## 2017-05-01 NOTE — ED Triage Notes (Signed)
Pt was seen and treated here last night for ETOH, her hemoglobin was 6, she didn't want to stay for any treatment at the time This am she states she woke up and she was cold and having a little central chest pain

## 2017-05-01 NOTE — ED Provider Notes (Signed)
[facility]-EMERGENCY DEPT Provider Note   CSN: [id number] Arrival date & time: 05/01/17  0005     History   Chief Complaint Chief Complaint  Patient presents with  . low hemoglobin    HPI Cynthia Clark is a 26 y.o. female with a hx of anemia 2/2 menorrhagia presents to the Emergency Department complaining of gradual, persistent, progressively worsening chest pain, SOB and fatigue onset this morning.  Pt was evaluated for acute EtOH intoxication last night and was found to be anemic. She was unwilling to stay for further evaluation and treatment last night. Pt reports she is supposed to be taking an iron supplement but has not been doing so.  She denies a hx of sickle cell disease.  Pt reports that exertion makes her SOB worse.  Pt denies fever, chills, headache, neck pain, leg swelling, nose bleeds, gingival bleeding.     HPI  History reviewed. No pertinent past medical history.  There are no active problems to display for this patient.   History reviewed. No pertinent surgical history.  OB History    No data available       Home Medications    Prior to Admission medications   Medication Sig Start Date End Date Taking? Authorizing Provider  acetaminophen (TYLENOL) 500 MG tablet Take 1,000 mg by mouth every 6 (six) hours as needed for mild pain or headache.   Yes [provider]  Aspirin-Salicylamide-Caffeine (ARTHRITIS STRENGTH BC POWDER PO) Take 1 packet by mouth daily as needed (HA).   Yes [provider]    Family History History reviewed. No pertinent family history.  Social History Social History  Substance Use Topics  . Smoking status: Current Every Day Smoker  . Smokeless tobacco: Never Used  . Alcohol use Yes     Allergies   Patient has no known allergies.   Review of Systems Review of Systems  Constitutional: Positive for chills and fatigue. Negative for appetite change, diaphoresis, fever and unexpected  weight change.  HENT: Negative for mouth sores.   Eyes: Negative for visual disturbance.  Respiratory: Positive for shortness of breath. Negative for cough, chest tightness and wheezing.   Cardiovascular: Positive for chest pain.  Gastrointestinal: Negative for abdominal pain, constipation, diarrhea, nausea and vomiting.  Endocrine: Negative for polydipsia, polyphagia and polyuria.  Genitourinary: Negative for dysuria, frequency, hematuria and urgency.  Musculoskeletal: Negative for back pain and neck stiffness.  Skin: Negative for rash.  Allergic/Immunologic: Negative for immunocompromised state.  Neurological: Negative for syncope, light-headedness and headaches.  Hematological: Does not bruise/bleed easily.  Psychiatric/Behavioral: Negative for sleep disturbance. The patient is not nervous/anxious.      Physical Exam Updated Vital Signs BP 108/60 (BP Location: Left Arm)   Pulse 72   Temp 99.3 F (37.4 C) (Oral)   Resp 18   LMP 04/15/2017   SpO2 100%   Physical Exam  Constitutional: She appears well-developed and well-nourished. No distress.  Awake, alert, nontoxic appearance  HENT:  Head: Normocephalic and atraumatic.  Mouth/Throat: Oropharynx is clear and moist. No oropharyngeal exudate.  Eyes: Conjunctivae are normal. No scleral icterus.  Neck: Normal range of motion. Neck supple.  Cardiovascular: Normal rate, regular rhythm and intact distal pulses.   Pulmonary/Chest: Effort normal and breath sounds normal. No respiratory distress. She has no wheezes.  Equal chest expansion  Abdominal: Soft. Bowel sounds are normal. She exhibits no mass. There is no tenderness. There is no rebound and no guarding.  Musculoskeletal: Normal range of  motion. She exhibits no edema.  Neurological: She is alert.  Speech is clear and goal oriented Moves extremities without ataxia  Skin: Skin is warm and dry. She is not diaphoretic.  Psychiatric: She has a normal mood and affect.  Nursing  note and vitals reviewed.    ED Treatments / Results  Labs (all labs ordered are listed, but only abnormal results are displayed) Labs Reviewed  BASIC METABOLIC PANEL - Abnormal; Notable for the following:       Result Value   Potassium 3.4 (*)    CO2 20 (*)    Calcium 8.7 (*)    All other components within normal limits  CBC - Abnormal; Notable for the following:    Hemoglobin 6.9 (*)    HCT 26.1 (*)    MCV 62.0 (*)    MCH 16.4 (*)    MCHC 26.4 (*)    RDW 19.7 (*)    All other components within normal limits  I-STAT TROPONIN, ED  TYPE AND SCREEN  PREPARE RBC (CROSSMATCH)    EKG  EKG Interpretation  Date/Time:  Thursday May 01 2017 00:34:44 EDT Ventricular Rate:  75 PR Interval:    QRS Duration: 75 QT Interval:  375 QTC Calculation: 419 R Axis:   68 Text Interpretation:  Sinus rhythm Baseline wander in lead(s) V5 Otherwise within normal limits No old tracing to compare Confirmed by Devoria AlbeKnapp, Iva ([id number]) on 05/01/2017 12:52:14 AM       Radiology Dg Chest 2 View  Result Date: 05/01/2017 CLINICAL DATA:  Chest pain. EXAM: CHEST  2 VIEW COMPARISON:  None. FINDINGS: The cardiomediastinal contours are normal. The lungs are clear. Pulmonary vasculature is normal. No consolidation, pleural effusion, or pneumothorax. No acute osseous abnormalities are seen. Incidental cervical ribs, left greater than right. IMPRESSION: No acute pulmonary process. Electronically Signed   By: Rubye OaksMelanie  Ehinger M.D.   On: 05/01/2017 01:09    Procedures Procedures (including critical care time)  CRITICAL CARE Performed by: Dierdre ForthMuthersbaugh, Suezette Lafave Total critical care time: 45 minutes Critical care time was exclusive of separately billable procedures and treating other patients. Critical care was necessary to treat or prevent imminent or life-threatening deterioration. Critical care was time spent personally by me on the following activities: development of treatment plan with patient and/or  surrogate as well as nursing, discussions with consultants, evaluation of patient's response to treatment, examination of patient, obtaining history from patient or surrogate, ordering and performing treatments and interventions, ordering and review of laboratory studies, ordering and review of radiographic studies, pulse oximetry and re-evaluation of patient's condition.   Medications Ordered in ED Medications  0.9 %  sodium chloride infusion (not administered)  0.9 %  sodium chloride infusion (10 mL/hr Intravenous New Bag/Given 05/01/17 0219)  potassium chloride SA (K-DUR,KLOR-CON) CR tablet 40 mEq (40 mEq Oral Given 05/01/17 0248)     Initial Impression / Assessment and Plan / ED Course  I have reviewed the triage vital signs and the nursing notes.  Pertinent labs & imaging results that were available during my care of the patient were reviewed by me and considered in my medical decision making (see chart for details).  Clinical Course as of May 01 312  Thu May 01, 2017  [id number] Discussed with Dr. Selena BattenKim who will admit.  [HM]    Clinical Course User Index [HM] Pollyann Roa, Dahlia ClientHannah, PA-C    Pt with symptomatic anemia.  Hgb 6.9 today, up from 6.1 yesterday, but pt continues to have symptoms of chest  pain, SOB and fatigue.  Long discussion about risk vs benefit of blood transfusion.  Pt is willing to stay for admission and treatment.    Final Clinical Impressions(s) / ED Diagnoses   Final diagnoses:  Symptomatic anemia  Iron deficiency anemia due to chronic blood loss    New Prescriptions New Prescriptions   No medications on file     Milta Deiters 05/01/17 1610    Devoria Albe, MD 05/01/17 [phone]

## 2017-05-02 LAB — TYPE AND SCREEN
ABO/RH(D): A POS
ANTIBODY SCREEN: NEGATIVE
UNIT DIVISION: 0
Unit division: 0

## 2017-05-02 LAB — BPAM RBC
BLOOD PRODUCT EXPIRATION DATE: 201811192359
Blood Product Expiration Date: 201811152359
ISSUE DATE / TIME: 201811010326
ISSUE DATE / TIME: 201811010612
UNIT TYPE AND RH: 6200
Unit Type and Rh: 6200

## 2017-05-02 LAB — HIV ANTIBODY (ROUTINE TESTING W REFLEX): HIV SCREEN 4TH GENERATION: NONREACTIVE

## 2017-08-18 ENCOUNTER — Encounter (HOSPITAL_COMMUNITY): Payer: Self-pay | Admitting: Emergency Medicine

## 2017-08-18 ENCOUNTER — Emergency Department (HOSPITAL_COMMUNITY)
Admission: EM | Admit: 2017-08-18 | Discharge: 2017-08-18 | Disposition: A | Discharge status: Home / Self Care | Payer: Self-pay | Attending: Emergency Medicine | Admitting: Emergency Medicine

## 2017-08-18 DIAGNOSIS — Z79899 Other long term (current) drug therapy: Secondary | ICD-10-CM | POA: Insufficient documentation

## 2017-08-18 DIAGNOSIS — F17210 Nicotine dependence, cigarettes, uncomplicated: Secondary | ICD-10-CM | POA: Insufficient documentation

## 2017-08-18 DIAGNOSIS — K602 Anal fissure, unspecified: Secondary | ICD-10-CM | POA: Insufficient documentation

## 2017-08-18 LAB — COMPREHENSIVE METABOLIC PANEL
ALK PHOS: 49 U/L (ref 38–126)
ALT: 17 U/L (ref 14–54)
ANION GAP: 10 (ref 5–15)
AST: 23 U/L (ref 15–41)
Albumin: 4.2 g/dL (ref 3.5–5.0)
BILIRUBIN TOTAL: 0.6 mg/dL (ref 0.3–1.2)
BUN: 7 mg/dL (ref 6–20)
CALCIUM: 8.8 mg/dL — AB (ref 8.9–10.3)
CO2: 24 mmol/L (ref 22–32)
CREATININE: 0.85 mg/dL (ref 0.44–1.00)
Chloride: 105 mmol/L (ref 101–111)
Glucose, Bld: 96 mg/dL (ref 65–99)
Potassium: 3.8 mmol/L (ref 3.5–5.1)
SODIUM: 139 mmol/L (ref 135–145)
TOTAL PROTEIN: 7.7 g/dL (ref 6.5–8.1)

## 2017-08-18 LAB — CBC
HCT: 34.6 % — ABNORMAL LOW (ref 36.0–46.0)
HEMOGLOBIN: 10.8 g/dL — AB (ref 12.0–15.0)
MCH: 25.5 pg — ABNORMAL LOW (ref 26.0–34.0)
MCHC: 31.2 g/dL (ref 30.0–36.0)
MCV: 81.6 fL (ref 78.0–100.0)
PLATELETS: 275 10*3/uL (ref 150–400)
RBC: 4.24 MIL/uL (ref 3.87–5.11)
RDW: 15.3 % (ref 11.5–15.5)
WBC: 4.5 10*3/uL (ref 4.0–10.5)

## 2017-08-18 LAB — TYPE AND SCREEN
ABO/RH(D): A POS
ANTIBODY SCREEN: NEGATIVE

## 2017-08-18 LAB — I-STAT BETA HCG BLOOD, ED (MC, WL, AP ONLY)

## 2017-08-18 NOTE — ED Triage Notes (Addendum)
Patient c/o bright red blood in stool x2 this morning. Denies abdominal pain and weakness. Hx anemia.

## 2017-08-18 NOTE — ED Provider Notes (Signed)
[facility]-EMERGENCY DEPT Provider Note   CSN: [id number] Arrival date & time: 08/18/17  1125     History   Chief Complaint Chief Complaint  Patient presents with  . Rectal Bleeding    HPI Cynthia Clark is a 27 y.o. female.  This is a 27 year old female who presents with rectal bleeding times 1 day.  Patient describes himself as having bright red blood per rectum when she has to move her stools.  Denies any abdominal pain.  No fever or chills.  States that she has had no vaginal bleeding.  This is been painless and she has had some constipation.  Does take iron tablets.  No treatment used prior to arrival.      Past Medical History:  Diagnosis Date  . Anemia     Patient Active Problem List   Diagnosis Date Noted  . Anemia 05/01/2017  . Symptomatic anemia 05/01/2017  . Chest pain 05/01/2017  . Hypokalemia 05/01/2017  . Menorrhagia 05/01/2017  . Costochondritis     History reviewed. No pertinent surgical history.  OB History    No data available       Home Medications    Prior to Admission medications   Medication Sig Start Date End Date Taking? Authorizing Provider  acetaminophen (TYLENOL) 500 MG tablet Take 1,000 mg by mouth every 6 (six) hours as needed for mild pain or headache.   Yes [provider]  Aspirin-Salicylamide-Caffeine (BC HEADACHE POWDER PO) Take 1 packet by mouth 2 (two) times daily as needed (headache).   Yes [provider]  ferrous sulfate 325 (65 FE) MG EC tablet Take 1 tablet (325 mg total) by mouth 3 (three) times daily with meals. 05/01/17  Yes Rodolph Bonghompson, Daniel V, MD  ibuprofen (ADVIL,MOTRIN) 200 MG tablet Take 200 mg by mouth every 6 (six) hours as needed for moderate pain.   Yes [provider]  naproxen sodium (ALEVE) 220 MG tablet Take 220 mg by mouth 2 (two) times daily as needed (pain).   Yes [provider]  ibuprofen (ADVIL,MOTRIN) 400 MG tablet Take 1 tablet (400 mg total)  by mouth 3 (three) times daily. Take 3 times daily x 4 days then every 6 hours as needed. Patient not taking: Reported on 08/18/2017 05/01/17   Rodolph Bonghompson, Daniel V, MD  senna (SENOKOT) 8.6 MG TABS tablet Take 1 tablet (8.6 mg total) by mouth at bedtime. Patient not taking: Reported on 08/18/2017 05/01/17   Rodolph Bonghompson, Daniel V, MD    Family History Family History  Problem Relation Age of Onset  . Cirrhosis Father     Social History Social History   Tobacco Use  . Smoking status: Current Every Day Smoker  . Smokeless tobacco: Never Used  Substance Use Topics  . Alcohol use: Yes  . Drug use: Yes    Types: Marijuana, Cocaine     Allergies   Patient has no known allergies.   Review of Systems Review of Systems  All other systems reviewed and are negative.    Physical Exam Updated Vital Signs BP (!) 148/91 (BP Location: Right Arm)   Pulse 78   Temp 98 F (36.7 C) (Oral)   Resp 18   LMP 07/18/2017   SpO2 100%   Physical Exam  Constitutional: She is oriented to person, place, and time. She appears well-developed and well-nourished.  Non-toxic appearance. No distress.  HENT:  Head: Normocephalic and atraumatic.  Eyes: Conjunctivae, EOM and lids are normal. Pupils are equal, round,  and reactive to light.  Neck: Normal range of motion. Neck supple. No tracheal deviation present. No thyroid mass present.  Cardiovascular: Normal rate, regular rhythm and normal heart sounds. Exam reveals no gallop.  No murmur heard. Pulmonary/Chest: Effort normal and breath sounds normal. No stridor. No respiratory distress. She has no decreased breath sounds. She has no wheezes. She has no rhonchi. She has no rales.  Abdominal: Soft. Normal appearance and bowel sounds are normal. She exhibits no distension. There is no tenderness. There is no rebound and no CVA tenderness.  Genitourinary: Rectal exam shows no external hemorrhoid, no internal hemorrhoid and no tenderness.  Musculoskeletal: Normal  range of motion. She exhibits no edema or tenderness.  Neurological: She is alert and oriented to person, place, and time. She has normal strength. No cranial nerve deficit or sensory deficit. GCS eye subscore is 4. GCS verbal subscore is 5. GCS motor subscore is 6.  Skin: Skin is warm and dry. No abrasion and no rash noted.  Psychiatric: She has a normal mood and affect. Her speech is normal and behavior is normal.  Nursing note and vitals reviewed.    ED Treatments / Results  Labs (all labs ordered are listed, but only abnormal results are displayed) Labs Reviewed  COMPREHENSIVE METABOLIC PANEL - Abnormal; Notable for the following components:      Result Value   Calcium 8.8 (*)    All other components within normal limits  CBC - Abnormal; Notable for the following components:   Hemoglobin 10.8 (*)    HCT 34.6 (*)    MCH 25.5 (*)    All other components within normal limits  I-STAT BETA HCG BLOOD, ED ([facility], [facility], [facility] ONLY)  POC OCCULT BLOOD, ED  TYPE AND SCREEN    EKG  EKG Interpretation None       Radiology No results found.  Procedures Procedures (including critical care time)  Medications Ordered in ED Medications - No data to display   Initial Impression / Assessment and Plan / ED Course  I have reviewed the triage vital signs and the nursing notes.  Pertinent labs & imaging results that were available during my care of the patient were reviewed by me and considered in my medical decision making (see chart for details).     Patient with nontender rectal exam.  Suspect anal fissure.  Will give return precautions  Final Clinical Impressions(s) / ED Diagnoses   Final diagnoses:  None    ED Discharge Orders    None       Lorre Nick, MD 08/18/17 2021

## 2017-08-18 NOTE — ED Notes (Signed)
Bed: WA22 Expected date:  Expected time:  Means of arrival:  Comments: 

## 2018-03-07 ENCOUNTER — Encounter (HOSPITAL_COMMUNITY): Payer: Self-pay | Admitting: Emergency Medicine

## 2018-03-07 ENCOUNTER — Emergency Department (HOSPITAL_COMMUNITY): Payer: Self-pay

## 2018-03-07 ENCOUNTER — Other Ambulatory Visit: Payer: Self-pay

## 2018-03-07 ENCOUNTER — Emergency Department (HOSPITAL_COMMUNITY)
Admission: EM | Admit: 2018-03-07 | Discharge: 2018-03-07 | Disposition: A | Discharge status: Home / Self Care | Payer: Self-pay | Attending: Emergency Medicine | Admitting: Emergency Medicine

## 2018-03-07 DIAGNOSIS — Y929 Unspecified place or not applicable: Secondary | ICD-10-CM | POA: Insufficient documentation

## 2018-03-07 DIAGNOSIS — Z79899 Other long term (current) drug therapy: Secondary | ICD-10-CM | POA: Insufficient documentation

## 2018-03-07 DIAGNOSIS — Y9389 Activity, other specified: Secondary | ICD-10-CM | POA: Insufficient documentation

## 2018-03-07 DIAGNOSIS — M79641 Pain in right hand: Secondary | ICD-10-CM | POA: Insufficient documentation

## 2018-03-07 DIAGNOSIS — F17200 Nicotine dependence, unspecified, uncomplicated: Secondary | ICD-10-CM | POA: Insufficient documentation

## 2018-03-07 DIAGNOSIS — Y999 Unspecified external cause status: Secondary | ICD-10-CM | POA: Insufficient documentation

## 2018-03-07 DIAGNOSIS — W2209XA Striking against other stationary object, initial encounter: Secondary | ICD-10-CM | POA: Insufficient documentation

## 2018-03-07 NOTE — Discharge Instructions (Addendum)
The x-ray of your hand today showed an old fracture to the right fifth metacarpal which has healed.  The x-ray did not show any new fractures today.  You may alternate taking Tylenol and Ibuprofen as needed for pain control. You may take 400-600 mg of ibuprofen every 6 hours and [id number] mg of Tylenol every 6 hours. Do not exceed 4000 mg of Tylenol daily as this can lead to liver damage. Also, make sure to take Ibuprofen with meals as it can cause an upset stomach. Do not take other NSAIDs while taking Ibuprofen such as (Aleve, Naprosyn, Aspirin, Celebrex, etc) and do not take more than the prescribed dose as this can lead to ulcers and bleeding in your GI tract. You may use warm and cold compresses to help with your symptoms.   You should follow-up with a hand doctor in regards to your injury today.  Please call the office to make an appointment for follow-up.  Please return to the ER sooner if you have any new or worsening symptoms.

## 2018-03-07 NOTE — ED Triage Notes (Signed)
Patient here from home with complaints of right little finger pain. Reports hx of fracture.

## 2018-03-07 NOTE — ED Provider Notes (Signed)
[facility]-EMERGENCY DEPT Provider Note   CSN: [id number] Arrival date & time: 03/07/18  1735     History   Chief Complaint Chief Complaint  Patient presents with  . Finger Injury    HPI Cynthia Clark is a 27 y.o. female.  HPI   Patient is a 27 year old female presents the emergency department today for evaluation of right hand pain that began suddenly yesterday after she punched an unknown object.  Patient reports pain is severe and constant in nature.  Worse with movement and palpation.  Reports swelling to the right hand as well.  Denies any other injuries or pain.  Has not tried any interventions for her symptoms.  States that she has no numbness or weakness to the hand or fingers.  Past Medical History:  Diagnosis Date  . Anemia     Patient Active Problem List   Diagnosis Date Noted  . Anemia 05/01/2017  . Symptomatic anemia 05/01/2017  . Chest pain 05/01/2017  . Hypokalemia 05/01/2017  . Menorrhagia 05/01/2017  . Costochondritis     History reviewed. No pertinent surgical history.   OB History   None      Home Medications    Prior to Admission medications   Medication Sig Start Date End Date Taking? Authorizing Provider  acetaminophen (TYLENOL) 500 MG tablet Take 1,000 mg by mouth every 6 (six) hours as needed for mild pain or headache.   Yes [provider]  ferrous sulfate 325 (65 FE) MG EC tablet Take 1 tablet (325 mg total) by mouth 3 (three) times daily with meals. 05/01/17  Yes Rodolph Bong, MD  ibuprofen (ADVIL,MOTRIN) 200 MG tablet Take 200 mg by mouth every 6 (six) hours as needed for moderate pain.   Yes [provider]  ibuprofen (ADVIL,MOTRIN) 400 MG tablet Take 1 tablet (400 mg total) by mouth 3 (three) times daily. Take 3 times daily x 4 days then every 6 hours as needed. Patient not taking: Reported on 08/18/2017 05/01/17   Rodolph Bong, MD  senna (SENOKOT) 8.6 MG TABS tablet Take 1 tablet (8.6  mg total) by mouth at bedtime. Patient not taking: Reported on 08/18/2017 05/01/17   Rodolph Bong, MD    Family History Family History  Problem Relation Age of Onset  . Cirrhosis Father     Social History Social History   Tobacco Use  . Smoking status: Current Every Day Smoker  . Smokeless tobacco: Never Used  Substance Use Topics  . Alcohol use: Yes  . Drug use: Yes    Types: Marijuana, Cocaine     Allergies   Patient has no known allergies.   Review of Systems Review of Systems  Constitutional: Negative for chills and fever.  Musculoskeletal:       Hand pain     Physical Exam Updated Vital Signs BP 109/60 (BP Location: Left Arm)   Pulse 89   Temp 99.2 F (37.3 C) (Oral)   Resp 18   LMP 02/21/2018   SpO2 100%   Physical Exam  Constitutional: She appears well-developed and well-nourished. No distress.  HENT:  Head: Normocephalic and atraumatic.  Eyes: Conjunctivae are normal.  Neck: Neck supple.  Cardiovascular: Normal rate.  Pulmonary/Chest: Effort normal.  Musculoskeletal: Normal range of motion.  5/5 grip strength to bilateral hands.  Tenderness and soft tissue swelling over the dorsal aspect of the right fifth metacarpal.  Range of motion of all fingers is intact.  Patient neurovascularly intact distally.  Neurological: She is alert.  Skin: Skin is warm and dry.  Psychiatric: She has a normal mood and affect.  Nursing note and vitals reviewed.  ED Treatments / Results  Labs (all labs ordered are listed, but only abnormal results are displayed) Labs Reviewed - No data to display  EKG None  Radiology Dg Hand Complete Right  Result Date: 03/07/2018 CLINICAL DATA:  Acute RIGHT hand pain following injury. Initial encounter. EXAM: RIGHT HAND - COMPLETE 3+ VIEW COMPARISON:  01/09/2017 radiographs FINDINGS: No acute fracture, subluxation or dislocation. A remote angulated fracture of the distal 5th metacarpal is noted. No focal bony lesions are  present. IMPRESSION: No acute bony abnormality. Remote 5th metacarpal fracture. Electronically Signed   By: Harmon Pier M.D.   On: 03/07/2018 19:28   Dg Finger Little Right  Result Date: 03/07/2018 CLINICAL DATA:  Acute RIGHT little finger pain following injury. Initial encounter. EXAM: RIGHT LITTLE FINGER 2+V COMPARISON:  01/09/2017 FINDINGS: No acute fracture, subluxation or dislocation. A remote angulated fracture of the distal 5th metacarpal is noted. No radiopaque foreign bodies identified. IMPRESSION: 1. No acute bony abnormality 2. Remote 5th metacarpal fracture. Electronically Signed   By: Harmon Pier M.D.   On: 03/07/2018 19:24    Procedures Procedures (including critical care time)  Medications Ordered in ED Medications - No data to display   Initial Impression / Assessment and Plan / ED Course  I have reviewed the triage vital signs and the nursing notes.  Pertinent labs & imaging results that were available during my care of the patient were reviewed by me and considered in my medical decision making (see chart for details).     Final Clinical Impressions(s) / ED Diagnoses   Final diagnoses:  Right hand pain   Patient complaining of pain to the right hand after she punched something last night.  No other injuries.  Vitals stable.  X-ray of the right fifth finger and was ordered by triage staff prior to my seeing the patient which did not give full view of the hand.  X-ray of the right hand showed a remote angulated fracture of the distal fifth metacarpal.  No new fractures or abnormalities.  Given the remoteness of the injury do not feel the ulnar gutter splint is necessary at this time.  Will give patient referral to orthopedics if she continues to have symptoms or discomfort from the injury.  Advised Tylenol, Motrin and rice protocol for her symptoms.  Return precautions discussed.  Patient voices understanding the plan reasons to return to ED.  All questions answered.  ED  Discharge Orders    None       Karrie Meres, [place] 03/07/18 1934    Pricilla Loveless, MD 03/07/18 [id number]

## 2018-05-18 ENCOUNTER — Encounter (HOSPITAL_COMMUNITY): Payer: Self-pay | Admitting: Emergency Medicine

## 2018-05-18 ENCOUNTER — Emergency Department (HOSPITAL_COMMUNITY)
Admission: EM | Admit: 2018-05-18 | Discharge: 2018-05-18 | Disposition: A | Discharge status: Home / Self Care | Payer: Self-pay | Attending: Emergency Medicine | Admitting: Emergency Medicine

## 2018-05-18 DIAGNOSIS — Z79899 Other long term (current) drug therapy: Secondary | ICD-10-CM | POA: Insufficient documentation

## 2018-05-18 DIAGNOSIS — R220 Localized swelling, mass and lump, head: Secondary | ICD-10-CM | POA: Insufficient documentation

## 2018-05-18 DIAGNOSIS — F17200 Nicotine dependence, unspecified, uncomplicated: Secondary | ICD-10-CM | POA: Insufficient documentation

## 2018-05-18 DIAGNOSIS — K0889 Other specified disorders of teeth and supporting structures: Secondary | ICD-10-CM | POA: Insufficient documentation

## 2018-05-18 MED ORDER — PENICILLIN V POTASSIUM 500 MG PO TABS
500.0000 mg | ORAL_TABLET | Freq: Once | ORAL | Status: AC
  Administered 2018-05-18: 500 mg via ORAL
  Filled 2018-05-18: qty 1

## 2018-05-18 MED ORDER — PENICILLIN V POTASSIUM 500 MG PO TABS: 500.0000 mg | ORAL_TABLET | Freq: Three times a day (TID) | ORAL | 0 refills | Status: DC

## 2018-05-18 NOTE — ED Triage Notes (Signed)
Pt c/o right sided dental swelling and tooth pain that caused by a bad tooth that needs to be pulled. Reports swelling started today when woke up.

## 2018-05-18 NOTE — Discharge Instructions (Signed)
Please call a dentist for follow up °

## 2018-05-18 NOTE — ED Provider Notes (Signed)
[name] Provider Note   CSN: [id number] Arrival date & time: 05/18/18  1029     History   Chief Complaint Chief Complaint  Patient presents with  . Oral Swelling    HPI Cynthia Clark is a 27 y.o. female.  HPI Patient is a 27 year old female who presents the emergency department with right-sided facial swelling and right-sided dental pain over the past 12 hours.  No difficulty breathing or swallowing.  She reports some pain with opening her mouth.  She has not seen a dentist.  Symptoms are moderate in severity.  Denies anterior neck pain  Past Medical History:  Diagnosis Date  . Anemia     Patient Active Problem List   Diagnosis Date Noted  . Anemia 05/01/2017  . Symptomatic anemia 05/01/2017  . Chest pain 05/01/2017  . Hypokalemia 05/01/2017  . Menorrhagia 05/01/2017  . Costochondritis     History reviewed. No pertinent surgical history.   OB History   None      Home Medications    Prior to Admission medications   Medication Sig Start Date End Date Taking? Authorizing Provider  acetaminophen (TYLENOL) 500 MG tablet Take 1,000 mg by mouth every 6 (six) hours as needed for mild pain or headache.    [provider]  ferrous sulfate 325 (65 FE) MG EC tablet Take 1 tablet (325 mg total) by mouth 3 (three) times daily with meals. 05/01/17   Rodolph Bonghompson, Daniel V, MD  ibuprofen (ADVIL,MOTRIN) 200 MG tablet Take 200 mg by mouth every 6 (six) hours as needed for moderate pain.    [provider]  ibuprofen (ADVIL,MOTRIN) 400 MG tablet Take 1 tablet (400 mg total) by mouth 3 (three) times daily. Take 3 times daily x 4 days then every 6 hours as needed. Patient not taking: Reported on 08/18/2017 05/01/17   Rodolph Bonghompson, Daniel V, MD  penicillin v potassium (VEETID) 500 MG tablet Take 1 tablet (500 mg total) by mouth 3 (three) times daily. 05/18/18   Azalia Bilisampos, Iva Montelongo, MD  senna (SENOKOT) 8.6 MG TABS tablet Take 1 tablet (8.6 mg  total) by mouth at bedtime. Patient not taking: Reported on 08/18/2017 05/01/17   Rodolph Bonghompson, Daniel V, MD    Family History Family History  Problem Relation Age of Onset  . Cirrhosis Father     Social History Social History   Tobacco Use  . Smoking status: Current Every Day Smoker  . Smokeless tobacco: Never Used  Substance Use Topics  . Alcohol use: Yes  . Drug use: Yes    Types: Marijuana, Cocaine     Allergies   Patient has no known allergies.   Review of Systems Review of Systems  All other systems reviewed and are negative.    Physical Exam Updated Vital Signs BP 125/76 (BP Location: Right Arm)   Pulse 94   Temp 98.7 F (37.1 C) (Oral)   Resp 16   LMP 05/11/2018   SpO2 99%   Physical Exam  Constitutional: She is oriented to person, place, and time. She appears well-developed and well-nourished.  HENT:  Head: Normocephalic.  Right-sided facial swelling.  Tenderness of the right lower second molar.  No gingival swelling or fluctuance.  No drainage.  Oral airway patent.  Tolerating secretions.  No stridor.  Tongue normal in size.  Anterior neck normal  Eyes: EOM are normal.  Neck: Normal range of motion. Neck supple.  Pulmonary/Chest: Effort normal.  Abdominal: She exhibits no distension.  Musculoskeletal: Normal  range of motion.  Lymphadenopathy:    She has no cervical adenopathy.  Neurological: She is alert and oriented to person, place, and time.  Psychiatric: She has a normal mood and affect.  Nursing note and vitals reviewed.    ED Treatments / Results  Labs (all labs ordered are listed, but only abnormal results are displayed) Labs Reviewed - No data to display  EKG None  Radiology No results found.  Procedures Procedures (including critical care time)  Medications Ordered in ED Medications  penicillin v potassium (VEETID) tablet 500 mg (has no administration in time range)     Initial Impression / Assessment and Plan / ED Course    I have reviewed the triage vital signs and the nursing notes.  Pertinent labs & imaging results that were available during my care of the patient were reviewed by me and considered in my medical decision making (see chart for details).    Dental Pain. Home with antibiotics and pain medicine. Recommend dental follow up. No signs of gingival abscess. Tolerating secretions. Airway patent. No sub lingular swelling.  Instructed the patient to return to the ER for new or worsening symptoms including returning tomorrow for repeat evaluation by myself   Final Clinical Impressions(s) / ED Diagnoses   Final diagnoses:  Pain, dental  Facial swelling    ED Discharge Orders         Ordered    penicillin v potassium (VEETID) 500 MG tablet  3 times daily     05/18/18 1103           Azalia Bilis, MD 05/18/18 1110

## 2018-07-28 ENCOUNTER — Other Ambulatory Visit: Payer: Self-pay

## 2018-07-28 ENCOUNTER — Emergency Department (HOSPITAL_COMMUNITY)
Admission: EM | Admit: 2018-07-28 | Discharge: 2018-07-28 | Disposition: A | Discharge status: Home / Self Care | Payer: Self-pay | Attending: Emergency Medicine | Admitting: Emergency Medicine

## 2018-07-28 ENCOUNTER — Encounter (HOSPITAL_COMMUNITY): Payer: Self-pay | Admitting: Emergency Medicine

## 2018-07-28 DIAGNOSIS — F17200 Nicotine dependence, unspecified, uncomplicated: Secondary | ICD-10-CM | POA: Insufficient documentation

## 2018-07-28 DIAGNOSIS — R600 Localized edema: Secondary | ICD-10-CM | POA: Insufficient documentation

## 2018-07-28 DIAGNOSIS — Z79899 Other long term (current) drug therapy: Secondary | ICD-10-CM | POA: Insufficient documentation

## 2018-07-28 DIAGNOSIS — K0889 Other specified disorders of teeth and supporting structures: Secondary | ICD-10-CM | POA: Insufficient documentation

## 2018-07-28 MED ORDER — CHLORHEXIDINE GLUCONATE 0.12 % MT SOLN: 15.0000 mL | Freq: Two times a day (BID) | OROMUCOSAL | 0 refills | Status: AC

## 2018-07-28 MED ORDER — PENICILLIN V POTASSIUM 500 MG PO TABS: 500.0000 mg | ORAL_TABLET | Freq: Four times a day (QID) | ORAL | 0 refills | Status: AC

## 2018-07-28 NOTE — Discharge Instructions (Signed)
You have been diagnosed today with Dental Pain.   At this time there does not appear to be the presence of an emergent medical condition, however there is always the potential for conditions to change. Please read and follow the below instructions.  Please return to the Emergency Department immediately for any new or worsening symptoms or if your symptoms do not improve within 3 days. Please be sure to follow up with your Primary Care Provider this week regarding your visit today; please call their office to schedule an appointment even if you are feeling better for a follow-up visit.  You have been given contact information for [facility] to establish a primary care provider. Please use the antibiotic penicillin as prescribed to help with your dental pain and swelling.  You may use the mouth rinse Peridex as prescribed as well, do not swallow Peridex. Please follow-up with a dentist regarding your multiple decaying teeth and swelling.  A resource guide has been attached below for you to find a local dentist.  Please attempt to see dentist sometime within the next week.   Get help right away if you: Are unable to open your mouth. Are having trouble breathing or swallowing. Have a fever. Noticed that you have swelling underneath your jaw or on your neck.  Dental Assistance  If unable to pay or uninsured, contact:  [facility] or [facility]. to become qualified for the adult dental clinic.  Patients with Medicaid: [facility] [phone] [address], [phone] [address] [phone]  If unable to pay, or uninsured, contact [facility] [phone]) or [facility] Department [phone] in [place], [phone] in [facility]) to become qualified for the adult dental clinic   Other Low-Cost Community Dental Services: [facility]- [address] [place], [place], [place], [address], [phone], [phone], 2nd and 4th  Thursday of the month at 6:30am.  10 clients each day by appointment, can sometimes see walk-in patients if someone does not show for an appointment. [facility]- [address] [place], [place], [address], [phone] [facility] [address], [place], [place], [address], [phone] [facility] Department- [phone] [facility] Health Department- [phone] [facility] [phone]     Please read the additional information packets attached to your discharge summary.  Do not take your medicine if  develop an itchy rash, swelling in your mouth or lips, or difficulty breathing.

## 2018-07-28 NOTE — ED Provider Notes (Addendum)
[facility] Provider Note   CSN: [id number] Arrival date & time: 07/28/18  1031     History   Chief Complaint Chief Complaint  Patient presents with  . Dental Pain    HPI Cynthia Clark is a 28 y.o. female presents today for concern of right lower dental pain and swelling.  Patient with similar symptoms in November of last year that resolved after antibiotics.  Patient has been unable to follow-up with dentist since that time.  Patient states that her swelling and pain began 2 days ago, pain is very mild in intensity only present with biting down on the right side, aching pain, pain resolves when not biting down on tooth.  Patient denies taking medication for her pain and does not request pain medicine today.   Patient denies history of fever, pain with eye movement, difficulty swallowing, difficulty breathing, pain or swelling of the neck or submandibular area.  Of note patient reports she is currently menstruating, denies possibility of pregnancy.  Patient without any other concerns today. HPI  Past Medical History:  Diagnosis Date  . Anemia     Patient Active Problem List   Diagnosis Date Noted  . Anemia 05/01/2017  . Symptomatic anemia 05/01/2017  . Chest pain 05/01/2017  . Hypokalemia 05/01/2017  . Menorrhagia 05/01/2017  . Costochondritis     History reviewed. No pertinent surgical history.   OB History   No obstetric history on file.      Home Medications    Prior to Admission medications   Medication Sig Start Date End Date Taking? Authorizing Provider  acetaminophen (TYLENOL) 500 MG tablet Take 1,000 mg by mouth every 6 (six) hours as needed for mild pain or headache.    [provider]  chlorhexidine (PERIDEX) 0.12 % solution Use as directed 15 mLs in the mouth or throat 2 (two) times daily. 07/28/18   Harlene SaltsMorelli, Daelan Gatt A, PA-C  ferrous sulfate 325 (65 FE) MG EC tablet Take 1 tablet (325 mg total) by mouth 3  (three) times daily with meals. 05/01/17   Rodolph Bonghompson, Daniel V, MD  ibuprofen (ADVIL,MOTRIN) 200 MG tablet Take 200 mg by mouth every 6 (six) hours as needed for moderate pain.    [provider]  ibuprofen (ADVIL,MOTRIN) 400 MG tablet Take 1 tablet (400 mg total) by mouth 3 (three) times daily. Take 3 times daily x 4 days then every 6 hours as needed. Patient not taking: Reported on 08/18/2017 05/01/17   Rodolph Bonghompson, Daniel V, MD  penicillin v potassium (VEETID) 500 MG tablet Take 1 tablet (500 mg total) by mouth 4 (four) times daily for 7 days. 07/28/18 08/04/18  Bill SalinasMorelli, Shia Delaine A, PA-C  senna (SENOKOT) 8.6 MG TABS tablet Take 1 tablet (8.6 mg total) by mouth at bedtime. Patient not taking: Reported on 08/18/2017 05/01/17   Rodolph Bonghompson, Daniel V, MD    Family History Family History  Problem Relation Age of Onset  . Cirrhosis Father     Social History Social History   Tobacco Use  . Smoking status: Current Every Day Smoker  . Smokeless tobacco: Never Used  Substance Use Topics  . Alcohol use: Yes  . Drug use: Yes    Types: Marijuana, Cocaine     Allergies   Patient has no known allergies.   Review of Systems Review of Systems  Constitutional: Negative for chills and fever.  HENT: Positive for dental problem and facial swelling. Negative for drooling, sinus pain, sore throat, trouble swallowing  and voice change.   Eyes: Negative.  Negative for pain and visual disturbance.  Respiratory: Negative.  Negative for choking and shortness of breath.   Musculoskeletal: Negative.  Negative for neck pain and neck stiffness.   Physical Exam Updated Vital Signs BP (!) 127/48 (BP Location: Right Arm)   Pulse (!) 103   Temp 98.5 F (36.9 C) (Oral)   Resp 16   Ht 5\' 4"  (1.626 m)   Wt 61.7 kg   LMP 07/28/2018   SpO2 100%   BMI 23.34 kg/m   Physical Exam Constitutional:      General: She is not in acute distress.    Appearance: Normal appearance. She is well-developed. She is not  ill-appearing or diaphoretic.  HENT:     Head: Normocephalic and atraumatic.     Jaw: There is normal jaw occlusion. Swelling present. No trismus, pain on movement or malocclusion.      Comments: Mild swelling of lateral right mandible, does not cross below, no submandibular involvement.    Right Ear: Tympanic membrane, ear canal and external ear normal.     Left Ear: Tympanic membrane, ear canal and external ear normal.     Nose: Nose normal.     Mouth/Throat:     Mouth: Mucous membranes are moist.     Pharynx: Oropharynx is clear.      Comments: Patient with multiple dental caries, poor dentition overall.  Patient's area of concern is marked in chart above.  There is mild swelling to the lateral right jaw however no area of fluctuance or sign of abscess.  The patient has normal phonation and is in control of secretions. No stridor.  Midline uvula without edema. Soft palate rises symmetrically. No tonsillar erythema, swelling or exudates. Tongue protrusion is normal, floor of mouth is soft. No trismus. No creptius on neck palpation and patient. No gingival erythema or fluctuance noted. Mucus membranes moist. No pallor noted. Eyes:     Extraocular Movements: Extraocular movements intact.     Conjunctiva/sclera: Conjunctivae normal.     Pupils: Pupils are equal, round, and reactive to light.  Neck:     Musculoskeletal: Full passive range of motion without pain, normal range of motion and neck supple. No edema, neck rigidity, crepitus, spinous process tenderness or muscular tenderness.     Trachea: Trachea and phonation normal. No tracheal tenderness or tracheal deviation.  Pulmonary:     Effort: Pulmonary effort is normal. No respiratory distress.     Breath sounds: No stridor.  Abdominal:     Palpations: Abdomen is soft.     Tenderness: There is no abdominal tenderness. There is no guarding or rebound.  Musculoskeletal: Normal range of motion.  Skin:    General: Skin is warm and dry.       Capillary Refill: Capillary refill takes less than 2 seconds.  Neurological:     Mental Status: She is alert.     GCS: GCS eye subscore is 4. GCS verbal subscore is 5. GCS motor subscore is 6.  Psychiatric:        Behavior: Behavior normal.    ED Treatments / Results  Labs (all labs ordered are listed, but only abnormal results are displayed) Labs Reviewed - No data to display  EKG None  Radiology No results found.  Procedures Procedures (including critical care time)  Medications Ordered in ED Medications - No data to display   Initial Impression / Assessment and Plan / ED Course  I have reviewed  the triage vital signs and the nursing notes.  Pertinent labs & imaging results that were available during my care of the patient were reviewed by me and considered in my medical decision making (see chart for details).   28 year old otherwise healthy female presents today for 2 days of right-sided dental pain and swelling.  Multiple infected dental surface cavity noted, without signs or symptoms of dental abscess, no fluctuance/erythema/tenderness of the gums.  Patient with mild swelling of the right lateral mandible which does not cross below the jaw. Patient is well-appearing, afebrile, nontoxic, speaking well.  Patient able to swallow without pain.  No signs of swelling or concern for Ludwig's angina/Peritonsilar abscess/Retropharyngeal abscess or other deep tissue infections.  No sign of swelling of the neck, patient has good range of motion of the neck, no trismus. Will treat with Amoxicillin, compresses and give dental resource guide.  At this time there does not appear to be any evidence of an acute emergency medical condition and the patient appears stable for discharge with appropriate outpatient follow up. Diagnosis was discussed with patient who verbalizes understanding of care plan and is agreeable to discharge. I have discussed return precautions with patient who  verbalizes understanding of return precautions. Patient strongly encouraged to follow-up with their PCP and dental. All questions answered.  Patient seen and evaluated by Dr. Jacqulyn Bath during this visit who agrees with plan of care, discharge with empiric antibiotics and dental resources at this time.  Note: Portions of this report may have been transcribed using voice recognition software. Every effort was made to ensure accuracy; however, inadvertent computerized transcription errors may still be present. Final Clinical Impressions(s) / ED Diagnoses   Final diagnoses:  Pain, dental    ED Discharge Orders         Ordered    penicillin v potassium (VEETID) 500 MG tablet  4 times daily     07/28/18 1231    chlorhexidine (PERIDEX) 0.12 % solution  2 times daily     07/28/18 1231           Elizabeth Palau 07/28/18 1234    Elizabeth Palau 07/28/18 1239    Maia Plan, MD 07/28/18 1824

## 2018-07-28 NOTE — ED Notes (Signed)
Bed: WTR7 Expected date:  Expected time:  Means of arrival:  Comments: 

## 2018-07-28 NOTE — ED Triage Notes (Signed)
Patient c/o right lower dental pain worsening x2 days. Swelling to right face. Denies difficulty breathing. Hx of same.

## 2018-12-07 ENCOUNTER — Ambulatory Visit (HOSPITAL_COMMUNITY)
Admission: EM | Admit: 2018-12-07 | Discharge: 2018-12-07 | Disposition: A | Discharge status: Home / Self Care | Payer: Self-pay | Attending: Family Medicine | Admitting: Family Medicine

## 2018-12-07 ENCOUNTER — Other Ambulatory Visit: Payer: Self-pay

## 2018-12-07 ENCOUNTER — Encounter (HOSPITAL_COMMUNITY): Payer: Self-pay

## 2018-12-07 DIAGNOSIS — N898 Other specified noninflammatory disorders of vagina: Secondary | ICD-10-CM

## 2018-12-07 DIAGNOSIS — N766 Ulceration of vulva: Secondary | ICD-10-CM

## 2018-12-07 MED ORDER — ACYCLOVIR 400 MG PO TABS: 400.0000 mg | ORAL_TABLET | Freq: Three times a day (TID) | ORAL | 0 refills | Status: AC

## 2018-12-07 NOTE — Discharge Instructions (Signed)
These lesions are concerning for herpes, we swabbed them and will call with results Begin acyclovir three times daily for 10 days We are also checking for gonorrhea, chlamydia, trichomonas, bacterial vaginosis and yeast which also often cause irritation If you develop increased pain and swelling, drainage please follow up for recheck

## 2018-12-07 NOTE — ED Triage Notes (Signed)
Pt presents with vaginal irritation after using hydrocortisone cream for vaginal itch a few days ago.

## 2018-12-08 NOTE — ED Provider Notes (Signed)
[facility]    CSN: [id number] Arrival date & time: 12/07/18  1241     History   Chief Complaint Chief Complaint  Patient presents with  . Vaginal Discomfort    HPI Cynthia Clark is a 28 y.o. female no contributing past medical history presenting today for evaluation of vaginal area irritation and itching.  Patient states that a few days ago she started develop an itch mainly in her rectal area.  She applied hydrocortisone cream and then afterward she started to have more of a burning irritated sensation rectally and near her perineum.  She denies any abnormal discharge.  Denies pelvic pain.  Denies urinary symptoms of dysuria or increased frequency.  Does have some mild burning with urination if it reaches the area of irritation.  She has noticed a couple "cuts" on her perineum.  Denies any new recent partners or specific concerns for STDs.  HPI  Past Medical History:  Diagnosis Date  . Anemia     Patient Active Problem List   Diagnosis Date Noted  . Anemia 05/01/2017  . Symptomatic anemia 05/01/2017  . Chest pain 05/01/2017  . Hypokalemia 05/01/2017  . Menorrhagia 05/01/2017  . Costochondritis     History reviewed. No pertinent surgical history.  OB History   No obstetric history on file.      Home Medications    Prior to Admission medications   Medication Sig Start Date End Date Taking? Authorizing Provider  acetaminophen (TYLENOL) 500 MG tablet Take 1,000 mg by mouth every 6 (six) hours as needed for mild pain or headache.    [provider]  acyclovir (ZOVIRAX) 400 MG tablet Take 1 tablet (400 mg total) by mouth 3 (three) times daily for 10 days. 12/07/18 12/17/18  Addylin Manke C, PA-C  chlorhexidine (PERIDEX) 0.12 % solution Use as directed 15 mLs in the mouth or throat 2 (two) times daily. 07/28/18   Nuala Alpha A, PA-C  ferrous sulfate 325 (65 FE) MG EC tablet Take 1 tablet (325 mg total) by mouth 3 (three) times daily with meals.  05/01/17   Eugenie Filler, MD  ibuprofen (ADVIL,MOTRIN) 200 MG tablet Take 200 mg by mouth every 6 (six) hours as needed for moderate pain.    [provider]  ibuprofen (ADVIL,MOTRIN) 400 MG tablet Take 1 tablet (400 mg total) by mouth 3 (three) times daily. Take 3 times daily x 4 days then every 6 hours as needed. Patient not taking: Reported on 08/18/2017 05/01/17   Eugenie Filler, MD  senna (SENOKOT) 8.6 MG TABS tablet Take 1 tablet (8.6 mg total) by mouth at bedtime. Patient not taking: Reported on 08/18/2017 05/01/17   Eugenie Filler, MD    Family History Family History  Problem Relation Age of Onset  . Cirrhosis Father     Social History Social History   Tobacco Use  . Smoking status: Current Every Day Smoker  . Smokeless tobacco: Never Used  Substance Use Topics  . Alcohol use: Yes  . Drug use: Yes    Types: Marijuana, Cocaine     Allergies   Patient has no known allergies.   Review of Systems Review of Systems  Constitutional: Negative for fever.  Respiratory: Negative for shortness of breath.   Cardiovascular: Negative for chest pain.  Gastrointestinal: Negative for abdominal pain, diarrhea, nausea and vomiting.  Genitourinary: Positive for genital sores. Negative for dysuria, flank pain, hematuria, menstrual problem, vaginal bleeding, vaginal discharge and vaginal pain.  Musculoskeletal:  Negative for back pain.  Skin: Negative for rash.  Neurological: Negative for dizziness, light-headedness and headaches.     Physical Exam Triage Vital Signs ED Triage Vitals  Enc Vitals Group     BP 12/07/18 1256 135/67     Pulse Rate 12/07/18 1256 70     Resp 12/07/18 1256 18     Temp 12/07/18 1256 98.5 F (36.9 C)     Temp Source 12/07/18 1256 Oral     SpO2 12/07/18 1256 100 %     Weight --      Height --      Head Circumference --      Peak Flow --      Pain Score 12/07/18 1300 5     Pain Loc --      Pain Edu? --      Excl. in GC? --    No  data found.  Updated Vital Signs BP 135/67 (BP Location: Right Arm)   Pulse 70   Temp 98.5 F (36.9 C) (Oral)   Resp 18   LMP 11/24/2018   SpO2 100%   Visual Acuity Right Eye Distance:   Left Eye Distance:   Bilateral Distance:    Right Eye Near:   Left Eye Near:    Bilateral Near:     Physical Exam Vitals signs and nursing note reviewed.  Constitutional:      Appearance: She is well-developed.     Comments: No acute distress  HENT:     Head: Normocephalic and atraumatic.     Nose: Nose normal.  Eyes:     Conjunctiva/sclera: Conjunctivae normal.  Neck:     Musculoskeletal: Neck supple.  Cardiovascular:     Rate and Rhythm: Normal rate.  Pulmonary:     Effort: Pulmonary effort is normal. No respiratory distress.  Abdominal:     General: There is no distension.  Genitourinary:    Comments: Normal external female genitalia without tenderness to palpation of bilateral labia majora, minora, no lesions noted in these areas, perineum with 2 yellowish ulcerative lesions, tender to touch, no surrounding erythema or induration  Vaginal vault with mixture of yellowish and whitish discharge, cervix with slight discoloration  Rectal exam without any sign of hemorrhoids, multiple similar-appearing yellowish ulcerative lesions around rectal area, see picture below Musculoskeletal: Normal range of motion.  Skin:    General: Skin is warm and dry.  Neurological:     Mental Status: She is alert and oriented to person, place, and time.          UC Treatments / Results  Labs (all labs ordered are listed, but only abnormal results are displayed) Labs Reviewed  HSV CULTURE AND TYPING  CERVICOVAGINAL ANCILLARY ONLY    EKG None  Radiology No results found.  Procedures Procedures (including critical care time)  Medications Ordered in UC Medications - No data to display  Initial Impression / Assessment and Plan / UC Course  I have reviewed the triage vital signs and  the nursing notes.  Pertinent labs & imaging results that were available during my care of the patient were reviewed by me and considered in my medical decision making (see chart for details).     Ulcerative lesions concerning for herpes.  Swab lesions on perineum for HSV.  Also obtained vaginal swab to check for causes of discharge to include yeast, BV as well as STDs.  Given appearance, empirically initiated on acyclovir.  Did discuss with patient to continue to monitor  for development of any increased redness swelling or pain as this may be more suggestive of cellulitic/abscess/folliculitis.  Advised that if HSV swab comes back negative and has not had any improvement with taking the acyclovir, may send in doxycycline 100 mg twice daily for 10 days to try to treat more for folliculitis.  Warm soaks.  Continue to monitor,Discussed strict return precautions. Patient verbalized understanding and is agreeable with plan.  Final Clinical Impressions(s) / UC Diagnoses   Final diagnoses:  Genital ulcer, female  Vaginal irritation     Discharge Instructions     These lesions are concerning for herpes, we swabbed them and will call with results Begin acyclovir three times daily for 10 days We are also checking for gonorrhea, chlamydia, trichomonas, bacterial vaginosis and yeast which also often cause irritation If you develop increased pain and swelling, drainage please follow up for recheck   ED Prescriptions    Medication Sig Dispense Auth. Provider   acyclovir (ZOVIRAX) 400 MG tablet Take 1 tablet (400 mg total) by mouth 3 (three) times daily for 10 days. 30 tablet Zeeva Courser, BrewerHallie C, PA-C     Controlled Substance Prescriptions [place] Controlled Substance Registry consulted? Not Applicable   Lew DawesWieters, Shey Bartmess C, [place] JerseyPA-C 12/08/18 [id number]

## 2018-12-09 LAB — CERVICOVAGINAL ANCILLARY ONLY
Bacterial vaginitis: POSITIVE — AB
Candida vaginitis: NEGATIVE
Chlamydia: NEGATIVE
Neisseria Gonorrhea: NEGATIVE
Trichomonas: NEGATIVE

## 2018-12-09 LAB — HSV CULTURE AND TYPING

## 2018-12-10 ENCOUNTER — Telehealth (HOSPITAL_COMMUNITY): Payer: Self-pay | Admitting: Emergency Medicine

## 2018-12-10 MED ORDER — METRONIDAZOLE 500 MG PO TABS: 500.0000 mg | ORAL_TABLET | Freq: Two times a day (BID) | ORAL | 0 refills | Status: AC

## 2018-12-10 NOTE — Telephone Encounter (Signed)
Patient contacted and made aware of all results, all questions answered. Swapping medicine to different pharmacy

## 2018-12-10 NOTE — Telephone Encounter (Signed)
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 Bacterial vaginosis is positive. This was not treated at the urgent care visit.  Flagyl 500 mg BID x 7 days #14 no refills sent to patients pharmacy of choice.   Attempted to reach patient. No answer at this time. Voicemail left.

## 2020-01-09 ENCOUNTER — Inpatient Hospital Stay (HOSPITAL_COMMUNITY): Payer: Self-pay | Admitting: Certified Registered Nurse Anesthetist

## 2020-01-09 ENCOUNTER — Encounter (HOSPITAL_COMMUNITY): Admission: EM | Disposition: A | Discharge status: Home / Self Care | Payer: Self-pay | Source: Home / Self Care

## 2020-01-09 ENCOUNTER — Inpatient Hospital Stay (HOSPITAL_COMMUNITY)
Admission: EM | Admit: 2020-01-09 | Discharge: 2020-01-10 | DRG: 145 | Disposition: A | Discharge status: Home / Self Care | Payer: Self-pay | Attending: General Surgery | Admitting: General Surgery

## 2020-01-09 ENCOUNTER — Other Ambulatory Visit: Payer: Self-pay

## 2020-01-09 ENCOUNTER — Encounter (HOSPITAL_COMMUNITY): Payer: Self-pay

## 2020-01-09 ENCOUNTER — Emergency Department (HOSPITAL_COMMUNITY): Payer: Self-pay

## 2020-01-09 DIAGNOSIS — Z8616 Personal history of COVID-19: Secondary | ICD-10-CM

## 2020-01-09 DIAGNOSIS — W3400XA Accidental discharge from unspecified firearms or gun, initial encounter: Secondary | ICD-10-CM

## 2020-01-09 DIAGNOSIS — Z1812 Retained nonmagnetic metal fragments: Secondary | ICD-10-CM

## 2020-01-09 DIAGNOSIS — S0502XA Injury of conjunctiva and corneal abrasion without foreign body, left eye, initial encounter: Secondary | ICD-10-CM | POA: Diagnosis present

## 2020-01-09 DIAGNOSIS — S0183XA Puncture wound without foreign body of other part of head, initial encounter: Secondary | ICD-10-CM

## 2020-01-09 DIAGNOSIS — S0240DB Maxillary fracture, left side, initial encounter for open fracture: Principal | ICD-10-CM | POA: Diagnosis present

## 2020-01-09 HISTORY — PX: INCISION AND DRAINAGE OF WOUND: SHX1803

## 2020-01-09 LAB — CBC WITH DIFFERENTIAL/PLATELET
Abs Immature Granulocytes: 0.02 10*3/uL (ref 0.00–0.07)
Basophils Absolute: 0.1 10*3/uL (ref 0.0–0.1)
Basophils Relative: 1 %
Eosinophils Absolute: 0.2 10*3/uL (ref 0.0–0.5)
Eosinophils Relative: 2 %
HCT: 27.1 % — ABNORMAL LOW (ref 36.0–46.0)
Hemoglobin: 7 g/dL — ABNORMAL LOW (ref 12.0–15.0)
Immature Granulocytes: 0 %
Lymphocytes Relative: 48 %
Lymphs Abs: 3.6 10*3/uL (ref 0.7–4.0)
MCH: 16.5 pg — ABNORMAL LOW (ref 26.0–34.0)
MCHC: 25.8 g/dL — ABNORMAL LOW (ref 30.0–36.0)
MCV: 63.9 fL — ABNORMAL LOW (ref 80.0–100.0)
Monocytes Absolute: 0.8 10*3/uL (ref 0.1–1.0)
Monocytes Relative: 11 %
Neutro Abs: 2.9 10*3/uL (ref 1.7–7.7)
Neutrophils Relative %: 38 %
Platelets: 469 10*3/uL — ABNORMAL HIGH (ref 150–400)
RBC: 4.24 MIL/uL (ref 3.87–5.11)
RDW: 21.8 % — ABNORMAL HIGH (ref 11.5–15.5)
WBC: 7.6 10*3/uL (ref 4.0–10.5)
nRBC: 0 % (ref 0.0–0.2)

## 2020-01-09 LAB — I-STAT CHEM 8, ED
BUN: 6 mg/dL (ref 6–20)
Calcium, Ion: 1.16 mmol/L (ref 1.15–1.40)
Chloride: 104 mmol/L (ref 98–111)
Creatinine, Ser: 0.8 mg/dL (ref 0.44–1.00)
Glucose, Bld: 96 mg/dL (ref 70–99)
HCT: 28 % — ABNORMAL LOW (ref 36.0–46.0)
Hemoglobin: 9.5 g/dL — ABNORMAL LOW (ref 12.0–15.0)
Potassium: 2.9 mmol/L — ABNORMAL LOW (ref 3.5–5.1)
Sodium: 142 mmol/L (ref 135–145)
TCO2: 20 mmol/L — ABNORMAL LOW (ref 22–32)

## 2020-01-09 LAB — TYPE AND SCREEN
ABO/RH(D): A POS
Antibody Screen: NEGATIVE

## 2020-01-09 LAB — BASIC METABOLIC PANEL
Anion gap: 14 (ref 5–15)
BUN: 6 mg/dL (ref 6–20)
CO2: 20 mmol/L — ABNORMAL LOW (ref 22–32)
Calcium: 9.1 mg/dL (ref 8.9–10.3)
Chloride: 106 mmol/L (ref 98–111)
Creatinine, Ser: 0.81 mg/dL (ref 0.44–1.00)
GFR calc Af Amer: >60 mL/min (ref >60)
GFR calc non Af Amer: >60 mL/min (ref >60)
Glucose, Bld: 104 mg/dL — ABNORMAL HIGH (ref 70–99)
Potassium: 2.8 mmol/L — ABNORMAL LOW (ref 3.5–5.1)
Sodium: 140 mmol/L (ref 135–145)

## 2020-01-09 LAB — ABO/RH: ABO/RH(D): A POS

## 2020-01-09 LAB — I-STAT BETA HCG BLOOD, ED (MC, WL, AP ONLY): I-stat hCG, quantitative: <5 m[IU]/mL (ref <5)

## 2020-01-09 LAB — ETHANOL: Alcohol, Ethyl (B): 89 mg/dL — ABNORMAL HIGH (ref <10)

## 2020-01-09 LAB — HIV ANTIBODY (ROUTINE TESTING W REFLEX): HIV Screen 4th Generation wRfx: NONREACTIVE

## 2020-01-09 LAB — SARS CORONAVIRUS 2 BY RT PCR (HOSPITAL ORDER, PERFORMED IN ~~LOC~~ HOSPITAL LAB): SARS Coronavirus 2: NEGATIVE

## 2020-01-09 SURGERY — IRRIGATION AND DEBRIDEMENT WOUND
Anesthesia: General | Site: Face | Laterality: Left

## 2020-01-09 MED ORDER — DOCUSATE SODIUM 100 MG PO CAPS
200.0000 mg | ORAL_CAPSULE | Freq: Two times a day (BID) | ORAL | Status: DC
  Administered 2020-01-09 – 2020-01-10 (×3): 200 mg via ORAL
  Filled 2020-01-09 (×3): qty 2

## 2020-01-09 MED ORDER — FENTANYL CITRATE (PF) 250 MCG/5ML IJ SOLN
INTRAMUSCULAR | Status: AC
  Filled 2020-01-09: qty 5

## 2020-01-09 MED ORDER — CHLORHEXIDINE GLUCONATE 0.12 % MT SOLN
15.0000 mL | Freq: Four times a day (QID) | OROMUCOSAL | Status: DC
  Administered 2020-01-09 – 2020-01-10 (×3): 15 mL via OROMUCOSAL
  Filled 2020-01-09 (×5): qty 15

## 2020-01-09 MED ORDER — CHLORHEXIDINE GLUCONATE 0.12 % MT SOLN
15.0000 mL | Freq: Once | OROMUCOSAL | Status: AC
  Administered 2020-01-09: 15 mL via OROMUCOSAL

## 2020-01-09 MED ORDER — ACETAMINOPHEN 500 MG PO TABS
1000.0000 mg | ORAL_TABLET | Freq: Four times a day (QID) | ORAL | Status: DC
  Administered 2020-01-09 – 2020-01-10 (×5): 1000 mg via ORAL
  Filled 2020-01-09 (×5): qty 2

## 2020-01-09 MED ORDER — 0.9 % SODIUM CHLORIDE (POUR BTL) OPTIME
TOPICAL | Status: DC | PRN
  Administered 2020-01-09: 1000 mL

## 2020-01-09 MED ORDER — LACTATED RINGERS IV SOLN: INTRAVENOUS | Status: DC

## 2020-01-09 MED ORDER — ONDANSETRON HCL 4 MG/2ML IJ SOLN: 4.0000 mg | Freq: Four times a day (QID) | INTRAMUSCULAR | Status: DC | PRN

## 2020-01-09 MED ORDER — DEXAMETHASONE SODIUM PHOSPHATE 10 MG/ML IJ SOLN
INTRAMUSCULAR | Status: DC | PRN
  Administered 2020-01-09: 10 mg via INTRAVENOUS

## 2020-01-09 MED ORDER — ROCURONIUM BROMIDE 10 MG/ML (PF) SYRINGE
PREFILLED_SYRINGE | INTRAVENOUS | Status: AC
  Filled 2020-01-09: qty 10

## 2020-01-09 MED ORDER — ROCURONIUM BROMIDE 10 MG/ML (PF) SYRINGE
PREFILLED_SYRINGE | INTRAVENOUS | Status: DC | PRN
  Administered 2020-01-09: 50 mg via INTRAVENOUS

## 2020-01-09 MED ORDER — ONDANSETRON HCL 4 MG/2ML IJ SOLN
INTRAMUSCULAR | Status: AC
  Filled 2020-01-09: qty 2

## 2020-01-09 MED ORDER — POTASSIUM CHLORIDE 20 MEQ PO PACK
40.0000 meq | PACK | Freq: Once | ORAL | Status: AC
  Administered 2020-01-09: 40 meq via ORAL
  Filled 2020-01-09: qty 2

## 2020-01-09 MED ORDER — HYDROMORPHONE HCL 1 MG/ML IJ SOLN: 0.5000 mg | INTRAMUSCULAR | Status: DC | PRN

## 2020-01-09 MED ORDER — MORPHINE SULFATE (PF) 4 MG/ML IV SOLN
4.0000 mg | Freq: Once | INTRAVENOUS | Status: AC
  Administered 2020-01-09: 4 mg via INTRAVENOUS
  Filled 2020-01-09: qty 1

## 2020-01-09 MED ORDER — LIDOCAINE-EPINEPHRINE 1 %-1:100000 IJ SOLN
INTRAMUSCULAR | Status: AC
  Filled 2020-01-09: qty 1

## 2020-01-09 MED ORDER — DEXAMETHASONE SODIUM PHOSPHATE 10 MG/ML IJ SOLN
INTRAMUSCULAR | Status: AC
  Filled 2020-01-09: qty 1

## 2020-01-09 MED ORDER — OXYCODONE HCL 5 MG PO TABS
5.0000 mg | ORAL_TABLET | Freq: Four times a day (QID) | ORAL | Status: DC | PRN
  Administered 2020-01-09: 10 mg via ORAL
  Filled 2020-01-09: qty 2

## 2020-01-09 MED ORDER — BACITRACIN ZINC 500 UNIT/GM EX OINT
TOPICAL_OINTMENT | CUTANEOUS | Status: AC
  Filled 2020-01-09: qty 28.35

## 2020-01-09 MED ORDER — IOHEXOL 300 MG/ML  SOLN
75.0000 mL | Freq: Once | INTRAMUSCULAR | Status: AC | PRN
  Administered 2020-01-09: 75 mL via INTRAVENOUS

## 2020-01-09 MED ORDER — POLYMYXIN B-TRIMETHOPRIM 10000-0.1 UNIT/ML-% OP SOLN
1.0000 [drp] | Freq: Four times a day (QID) | OPHTHALMIC | Status: DC
  Administered 2020-01-09 – 2020-01-10 (×4): 1 [drp] via OPHTHALMIC
  Filled 2020-01-09: qty 10

## 2020-01-09 MED ORDER — PROPOFOL 10 MG/ML IV BOLUS
INTRAVENOUS | Status: DC | PRN
  Administered 2020-01-09: 200 mg via INTRAVENOUS

## 2020-01-09 MED ORDER — BACITRACIN ZINC 500 UNIT/GM EX OINT
TOPICAL_OINTMENT | CUTANEOUS | Status: DC | PRN
  Administered 2020-01-09 (×2): 1 via TOPICAL

## 2020-01-09 MED ORDER — CHLORHEXIDINE GLUCONATE 0.12 % MT SOLN
OROMUCOSAL | Status: AC
  Administered 2020-01-09: 15 mL
  Filled 2020-01-09: qty 15

## 2020-01-09 MED ORDER — BACITRACIN ZINC 500 UNIT/GM EX OINT
TOPICAL_OINTMENT | Freq: Two times a day (BID) | CUTANEOUS | Status: DC
  Administered 2020-01-09: 1 via TOPICAL
  Filled 2020-01-09 (×2): qty 28.35

## 2020-01-09 MED ORDER — SUGAMMADEX SODIUM 200 MG/2ML IV SOLN
INTRAVENOUS | Status: DC | PRN
  Administered 2020-01-09: 280 mg via INTRAVENOUS

## 2020-01-09 MED ORDER — FENTANYL CITRATE (PF) 100 MCG/2ML IJ SOLN
INTRAMUSCULAR | Status: DC | PRN
  Administered 2020-01-09: 100 ug via INTRAVENOUS
  Administered 2020-01-09: 150 ug via INTRAVENOUS

## 2020-01-09 MED ORDER — PROPOFOL 10 MG/ML IV BOLUS
INTRAVENOUS | Status: AC
  Filled 2020-01-09: qty 20

## 2020-01-09 MED ORDER — ONDANSETRON 4 MG PO TBDP: 4.0000 mg | ORAL_TABLET | Freq: Four times a day (QID) | ORAL | Status: DC | PRN

## 2020-01-09 MED ORDER — MIDAZOLAM HCL 5 MG/5ML IJ SOLN
INTRAMUSCULAR | Status: DC | PRN
  Administered 2020-01-09: 2 mg via INTRAVENOUS

## 2020-01-09 MED ORDER — ORAL CARE MOUTH RINSE: 15.0000 mL | Freq: Once | OROMUCOSAL | Status: AC

## 2020-01-09 MED ORDER — LIDOCAINE 2% (20 MG/ML) 5 ML SYRINGE
INTRAMUSCULAR | Status: DC | PRN
  Administered 2020-01-09: 60 mg via INTRAVENOUS

## 2020-01-09 MED ORDER — DEXMEDETOMIDINE HCL IN NACL 200 MCG/50ML IV SOLN
INTRAVENOUS | Status: DC | PRN
Start: 2020-01-09 — End: 2020-01-09
  Administered 2020-01-09 (×2): 12 ug via INTRAVENOUS
  Administered 2020-01-09 (×2): 8 ug via INTRAVENOUS

## 2020-01-09 MED ORDER — ONDANSETRON HCL 4 MG/2ML IJ SOLN
INTRAMUSCULAR | Status: DC | PRN
  Administered 2020-01-09: 4 mg via INTRAVENOUS

## 2020-01-09 MED ORDER — MIDAZOLAM HCL 2 MG/2ML IJ SOLN
INTRAMUSCULAR | Status: AC
  Filled 2020-01-09: qty 2

## 2020-01-09 MED ORDER — SUCCINYLCHOLINE CHLORIDE 200 MG/10ML IV SOSY
PREFILLED_SYRINGE | INTRAVENOUS | Status: DC | PRN
  Administered 2020-01-09: 100 mg via INTRAVENOUS

## 2020-01-09 MED ORDER — IBUPROFEN 600 MG PO TABS: 600.0000 mg | ORAL_TABLET | Freq: Four times a day (QID) | ORAL | Status: DC | PRN

## 2020-01-09 MED ORDER — LIDOCAINE 2% (20 MG/ML) 5 ML SYRINGE
INTRAMUSCULAR | Status: AC
  Filled 2020-01-09: qty 5

## 2020-01-09 MED ORDER — CEFAZOLIN SODIUM-DEXTROSE 2-3 GM-%(50ML) IV SOLR
INTRAVENOUS | Status: DC | PRN
  Administered 2020-01-09: 2 g via INTRAVENOUS

## 2020-01-09 SURGICAL SUPPLY — 32 items
BAG DECANTER FOR FLEXI CONT (MISCELLANEOUS) IMPLANT
BNDG GAUZE ELAST 4 BULKY (GAUZE/BANDAGES/DRESSINGS) IMPLANT
CANISTER SUCT 3000ML PPV (MISCELLANEOUS) ×2 IMPLANT
CNTNR URN SCR LID CUP LEK RST (MISCELLANEOUS) IMPLANT
CONT SPEC 4OZ STRL OR WHT (MISCELLANEOUS)
COVER SURGICAL LIGHT HANDLE (MISCELLANEOUS) ×2 IMPLANT
DRAPE HALF SHEET 40X57 (DRAPES) IMPLANT
DRAPE IMP U-DRAPE 54X76 (DRAPES) ×2 IMPLANT
DRAPE LAPAROTOMY 100X72 PEDS (DRAPES) ×2 IMPLANT
DRSG PAD ABDOMINAL 8X10 ST (GAUZE/BANDAGES/DRESSINGS) ×2 IMPLANT
ELECT CAUTERY BLADE 6.4 (BLADE) ×2 IMPLANT
ELECT COATED BLADE 2.86 ST (ELECTRODE) ×2 IMPLANT
ELECT REM PT RETURN 9FT ADLT (ELECTROSURGICAL) ×2
ELECTRODE REM PT RTRN 9FT ADLT (ELECTROSURGICAL) ×1 IMPLANT
GAUZE 4X4 16PLY RFD (DISPOSABLE) ×2 IMPLANT
GLOVE BIO SURGEON STRL SZ 6 (GLOVE) ×6 IMPLANT
GLOVE SURG SS PI 6.0 STRL IVOR (GLOVE) ×4 IMPLANT
GOWN STRL REUS W/ TWL LRG LVL3 (GOWN DISPOSABLE) ×2 IMPLANT
GOWN STRL REUS W/TWL LRG LVL3 (GOWN DISPOSABLE) ×2
KIT BASIN OR (CUSTOM PROCEDURE TRAY) ×2 IMPLANT
KIT TURNOVER KIT B (KITS) ×2 IMPLANT
NS IRRIG 1000ML POUR BTL (IV SOLUTION) ×2 IMPLANT
PACK GENERAL/GYN (CUSTOM PROCEDURE TRAY) ×2 IMPLANT
PACK UNIVERSAL I (CUSTOM PROCEDURE TRAY) ×2 IMPLANT
PAD ARMBOARD 7.5X6 YLW CONV (MISCELLANEOUS) ×4 IMPLANT
SUT CHROMIC 4 0 PS 2 18 (SUTURE) ×2 IMPLANT
SUT MON AB 5-0 P3 18 (SUTURE) ×2 IMPLANT
SUT PLAIN 5 0 P 3 18 (SUTURE) ×2 IMPLANT
SWAB COLLECTION DEVICE MRSA (MISCELLANEOUS) IMPLANT
SWAB CULTURE ESWAB REG 1ML (MISCELLANEOUS) IMPLANT
TOWEL GREEN STERILE (TOWEL DISPOSABLE) ×2 IMPLANT
TOWEL GREEN STERILE FF (TOWEL DISPOSABLE) ×2 IMPLANT
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
## 2020-01-09 NOTE — Anesthesia Postprocedure Evaluation (Signed)
Anesthesia Post Note  Patient: Cynthia Clark  Procedure(s) Performed: IRRIGATION AND DEBRIDEMENT, CHEEK AND MAXILLARY SINUS (Left Face)     Patient location during evaluation: PACU Anesthesia Type: General Level of consciousness: awake and alert Pain management: pain level controlled Vital Signs Assessment: post-procedure vital signs reviewed and stable Respiratory status: spontaneous breathing, nonlabored ventilation and respiratory function stable Cardiovascular status: blood pressure returned to baseline and stable Postop Assessment: no apparent nausea or vomiting Anesthetic complications: no   No complications documented.  Last Vitals:  Vitals:   01/09/20 1406 01/09/20 1440  BP: 99/68 102/65  Pulse: 60 76  Resp: 14 16  Temp: 36.8 C 36.8 C  SpO2: 98% 98%    Last Pain:  Vitals:   01/09/20 1414  TempSrc:   PainSc: Asleep                 Beryle Lathe

## 2020-01-09 NOTE — Anesthesia Procedure Notes (Signed)
Procedure Name: Intubation Date/Time: 01/09/2020 12:01 PM Performed by: Rosiland Oz, CRNA Pre-anesthesia Checklist: Patient identified, Emergency Drugs available, Suction available, Patient being monitored and Timeout performed Patient Re-evaluated:Patient Re-evaluated prior to induction Oxygen Delivery Method: Circle system utilized Preoxygenation: Pre-oxygenation with 100% oxygen Induction Type: IV induction Ventilation: Mask ventilation without difficulty Laryngoscope Size: Miller and 3 Grade View: Grade I Tube type: Oral Tube size: 7.0 mm Number of attempts: 1 Airway Equipment and Method: Stylet Placement Confirmation: ETT inserted through vocal cords under direct vision,  positive ETCO2 and breath sounds checked- equal and bilateral Tube secured with: Tape Dental Injury: Teeth and Oropharynx as per pre-operative assessment
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
## 2020-01-09 NOTE — Anesthesia Preprocedure Evaluation (Addendum)
Anesthesia Evaluation  Patient identified by MRN, date of birth, ID band Patient awake    Reviewed: Allergy & Precautions, NPO status , Patient's Chart, lab work & pertinent test results  History of Anesthesia Complications Negative for: history of anesthetic complications  Airway Mallampati: II  TM Distance: >3 FB Neck ROM: Full    Dental  (+) Dental Advisory Given, Teeth Intact   Pulmonary Current Smoker and Patient abstained from smoking.,    Pulmonary exam normal        Cardiovascular negative cardio ROS Normal cardiovascular exam     Neuro/Psych negative neurological ROS  negative psych ROS   GI/Hepatic negative GI ROS, (+)     substance abuse  marijuana use,   Endo/Other   Hypokalemia, K = 2.9   Renal/GU negative Renal ROS     Musculoskeletal negative musculoskeletal ROS (+)   Abdominal   Peds  Hematology  (+) anemia ,   Anesthesia Other Findings Covid test negative   Reproductive/Obstetrics                            Anesthesia Physical Anesthesia Plan  ASA: II  Anesthesia Plan: General   Post-op Pain Management:    Induction: Intravenous  PONV Risk Score and Plan: 3 and Treatment may vary due to age or medical condition, Ondansetron, Dexamethasone and Midazolam  Airway Management Planned: Oral ETT  Additional Equipment: None  Intra-op Plan:   Post-operative Plan: Extubation in OR  Informed Consent: I have reviewed the patients History and Physical, chart, labs and discussed the procedure including the risks, benefits and alternatives for the proposed anesthesia with the patient or authorized representative who has indicated his/her understanding and acceptance.     Dental advisory given  Plan Discussed with: CRNA and Anesthesiologist  Anesthesia Plan Comments:        Anesthesia Quick Evaluation
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
## 2020-01-09 NOTE — Transfer of Care (Signed)
Immediate Anesthesia Transfer of Care Note  Patient: Cynthia Clark  Procedure(s) Performed: IRRIGATION AND DEBRIDEMENT, CHEEK AND MAXILLARY SINUS (Left Face)  Patient Location: PACU  Anesthesia Type:General  Level of Consciousness: drowsy and patient cooperative  Airway & Oxygen Therapy: Patient Spontanous Breathing  Post-op Assessment: Report given to RN and Post -op Vital signs reviewed and stable  Post vital signs: Reviewed and stable  Last Vitals:  Vitals Value Taken Time  BP 110/86 01/09/20 1257  Temp    Pulse 77 01/09/20 1259  Resp 13 01/09/20 1259  SpO2 90 % 01/09/20 1259  Vitals shown include unvalidated device data.  Last Pain:  Vitals:   01/09/20 1007  TempSrc:   PainSc: 5       Patients Stated Pain Goal: 3 (01/09/20 1007)  Complications: No complications documented.
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
## 2020-01-09 NOTE — Plan of Care (Signed)
  Problem: Education: Goal: Knowledge of General Education information will improve Description: Including pain rating scale, medication(s)/side effects and non-pharmacologic comfort measures Outcome: Progressing   Problem: Health Behavior/Discharge Planning: Goal: Ability to manage health-related needs will improve Outcome: Progressing   Problem: Clinical Measurements: Goal: Will remain free from infection Outcome: Progressing   

## 2020-01-10 ENCOUNTER — Encounter (HOSPITAL_COMMUNITY): Payer: Self-pay | Admitting: Plastic Surgery

## 2020-01-10 MED ORDER — CEPHALEXIN 500 MG PO CAPS
500.0000 mg | ORAL_CAPSULE | Freq: Four times a day (QID) | ORAL | Status: DC
  Administered 2020-01-10 (×2): 500 mg via ORAL
  Filled 2020-01-10 (×2): qty 1

## 2020-01-10 MED ORDER — POLYMYXIN B-TRIMETHOPRIM 10000-0.1 UNIT/ML-% OP SOLN: 1.0000 [drp] | Freq: Four times a day (QID) | OPHTHALMIC | 0 refills | Status: AC

## 2020-01-10 MED ORDER — OXYCODONE HCL 5 MG PO TABS: 5.0000 mg | ORAL_TABLET | Freq: Four times a day (QID) | ORAL | 0 refills | Status: AC | PRN

## 2020-01-10 MED ORDER — ACETAMINOPHEN 325 MG PO TABS: 650.0000 mg | ORAL_TABLET | Freq: Four times a day (QID) | ORAL | Status: AC | PRN

## 2020-01-10 MED ORDER — BACITRACIN ZINC 500 UNIT/GM EX OINT: TOPICAL_OINTMENT | Freq: Two times a day (BID) | CUTANEOUS | 0 refills | Status: AC

## 2020-01-10 MED ORDER — CEPHALEXIN 500 MG PO CAPS: 500.0000 mg | ORAL_CAPSULE | Freq: Four times a day (QID) | ORAL | 0 refills | Status: AC

## 2020-01-10 MED FILL — oxyCODONE HCL 5 MG TABS: 5 | 4 days supply | Qty: 15 | Fill #0

## 2020-01-10 MED FILL — CEPHALEXIN 500 MG CAPS: 500 | 5 days supply | Qty: 20 | Fill #0

## 2020-01-10 MED FILL — SM ANTIBIOTIC 500 UNIT/GM O: 500 | 7 days supply | Qty: 28 | Fill #0

## 2020-01-10 MED FILL — POLYMYXIN B-TMP EYE DROPS: 10000-0.1 | 7 days supply | Qty: 10 | Fill #0
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
## 2020-01-10 NOTE — Progress Notes (Signed)
  Plastic surgery  POD#1 I&D left maxillary sinus closure wound  PO 740  Temp:  [98.1 F (36.7 C)-98.6 F (37 C)] 98.2 F (36.8 C) (07/12 0513) Pulse Rate:  [56-86] 61 (07/12 0513) Resp:  [11-17] 17 (07/12 0513) BP: (99-127)/(51-86) 102/51 (07/12 0513) SpO2:  [90 %-100 %] 99 % (07/12 0513)   PE Pt sleeping HEENT: repair intact, reduced but continued edema left face   A/P Stable from plastics standpoint for discharge. Soap and water ok. Ok to shower.  Soft diet through follow up. Refrain from smoking. No strenuous exercise lifting activity through follow up.Rinse mouth after each meal. Brush teeth after each meal. No seeds, nuts through follow up visit.  Bacitracin to nasal sutures daily. Eyedrops to left eye as per Ophthalmology.  Normal to have numbness left cheek left maxillary teeth for several weeks.  Recommend Keflex oral for discharge.  F/u Plastics 10-14 d.   Glenna Fellows, MD [facility] Plastic & Reconstructive Surgery
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
## 2020-01-10 NOTE — Plan of Care (Signed)

## 2020-01-10 NOTE — TOC CAGE-AID Note (Signed)
Transition of Care [facility]) - CAGE-AID Screening   Patient Details  Name: Catelin Manthe MRN: [id number] Date of Birth: [birth year]  [facility] CM/SW Contact:    Emeterio Reeve, [place] Phone Number: 01/10/2020, 1:04 PM   Clinical Narrative:  CSW met with pt at bedside. CSW introduced self and explained her role at the hospital. Pt states she has alcohol 1-2 times a week. Pt reports occasional marijuana use. Pt declines needing resources. Pt was receptive to Scientist, clinical (histocompatibility and immunogenetics).   CAGE-AID Screening:    Have You Ever Felt You Ought to Cut Down on Your Drinking or Drug Use?: No Have People Annoyed You By Critizing Your Drinking Or Drug Use?: No Have You Felt Bad Or Guilty About Your Drinking Or Drug Use?: No Have You Ever Had a Drink or Used Drugs First Thing In The Morning to STeady Your Nerves or to Get Rid of a Hangover?: No CAGE-AID Score: 0  Substance Abuse Education Offered: Yes  Substance abuse interventions: Patient Counseling, Educational Materials  Emeterio Reeve, Latanya Presser, [place] Social Worker [phone]
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
## 2020-01-11 ENCOUNTER — Encounter (HOSPITAL_COMMUNITY): Payer: Self-pay

## 2020-01-18 ENCOUNTER — Encounter (INDEPENDENT_AMBULATORY_CARE_PROVIDER_SITE_OTHER): Payer: Self-pay | Admitting: Primary Care

## 2020-01-18 ENCOUNTER — Other Ambulatory Visit: Payer: Self-pay

## 2020-01-18 ENCOUNTER — Ambulatory Visit (INDEPENDENT_AMBULATORY_CARE_PROVIDER_SITE_OTHER): Payer: Self-pay | Admitting: Primary Care

## 2020-01-18 VITALS — BP 104/67 | HR 98 | Temp 98.1°F | Ht 65.0 in | Wt 139.0 lb

## 2020-01-18 DIAGNOSIS — N920 Excessive and frequent menstruation with regular cycle: Secondary | ICD-10-CM

## 2020-01-18 DIAGNOSIS — D509 Iron deficiency anemia, unspecified: Secondary | ICD-10-CM

## 2020-01-18 DIAGNOSIS — Z7689 Persons encountering health services in other specified circumstances: Secondary | ICD-10-CM

## 2020-01-18 DIAGNOSIS — Z09 Encounter for follow-up examination after completed treatment for conditions other than malignant neoplasm: Secondary | ICD-10-CM

## 2020-01-18 DIAGNOSIS — R0989 Other specified symptoms and signs involving the circulatory and respiratory systems: Secondary | ICD-10-CM

## 2020-01-18 NOTE — Patient Instructions (Signed)
Iron rich foods such as shellfish,liver, organ meats(liver, gizzard), and red meats can increase cholesterol and should be consumed in moderation.However; legumes(beans), spinach, pumpkin seeds, turkey, broccoli, tofu, green leafy vegetables and dark chocolate can be consumed without concern to cholesterol.  °
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
## 2020-03-01 DEATH — deceased
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
## 2020-03-13 ENCOUNTER — Ambulatory Visit: Payer: Self-pay | Admitting: Cardiovascular Disease

## 2020-04-19 ENCOUNTER — Ambulatory Visit (INDEPENDENT_AMBULATORY_CARE_PROVIDER_SITE_OTHER): Payer: Self-pay | Admitting: Primary Care
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
